# Patient Record
Sex: Male | Born: 1965 | Race: White | Hispanic: No | Marital: Single | Smoking: Former smoker
Health system: Southern US, Community
[De-identification: ages and names within clinical notes are randomized; demographics above are authoritative.]

## PROBLEM LIST (undated history)

## (undated) DIAGNOSIS — R7303 Prediabetes: Secondary | ICD-10-CM

## (undated) DIAGNOSIS — E039 Hypothyroidism, unspecified: Secondary | ICD-10-CM

## (undated) HISTORY — DX: Hypothyroidism, unspecified: E03.9

## (undated) HISTORY — PX: SHOULDER SURGERY: SHX246

## (undated) HISTORY — DX: Prediabetes: R73.03

---

## 2010-08-29 ENCOUNTER — Emergency Department: Payer: Self-pay | Admitting: Internal Medicine

## 2013-03-22 ENCOUNTER — Ambulatory Visit: Payer: Self-pay | Admitting: General Practice

## 2013-07-18 ENCOUNTER — Ambulatory Visit: Payer: Self-pay | Admitting: Anesthesiology

## 2013-07-18 LAB — CBC
HCT: 41.9 % (ref 40.0–52.0)
HGB: 14.6 g/dL (ref 13.0–18.0)
MCH: 30.9 pg (ref 26.0–34.0)
MCV: 89 fL (ref 80–100)
Platelet: 255 10*3/uL (ref 150–440)
WBC: 6.9 10*3/uL (ref 3.8–10.6)

## 2013-07-18 LAB — BASIC METABOLIC PANEL
Anion Gap: 6 — ABNORMAL LOW (ref 7–16)
BUN: 12 mg/dL (ref 7–18)
Calcium, Total: 9.2 mg/dL (ref 8.5–10.1)
Chloride: 107 mmol/L (ref 98–107)
Co2: 27 mmol/L (ref 21–32)
Creatinine: 0.99 mg/dL (ref 0.60–1.30)
EGFR (African American): >60
EGFR (Non-African Amer.): >60
Potassium: 3.5 mmol/L (ref 3.5–5.1)
Sodium: 140 mmol/L (ref 136–145)

## 2013-07-18 LAB — APTT: Activated PTT: 29.2 secs (ref 23.6–35.9)

## 2013-07-20 ENCOUNTER — Ambulatory Visit: Payer: Self-pay | Admitting: Orthopedic Surgery

## 2014-08-08 ENCOUNTER — Ambulatory Visit (INDEPENDENT_AMBULATORY_CARE_PROVIDER_SITE_OTHER): Payer: 59 | Admitting: Psychology

## 2014-08-08 DIAGNOSIS — F4323 Adjustment disorder with mixed anxiety and depressed mood: Secondary | ICD-10-CM

## 2014-08-23 ENCOUNTER — Ambulatory Visit (INDEPENDENT_AMBULATORY_CARE_PROVIDER_SITE_OTHER): Payer: 59 | Admitting: Psychology

## 2014-08-23 DIAGNOSIS — F4323 Adjustment disorder with mixed anxiety and depressed mood: Secondary | ICD-10-CM

## 2014-09-13 ENCOUNTER — Ambulatory Visit (INDEPENDENT_AMBULATORY_CARE_PROVIDER_SITE_OTHER): Payer: 59 | Admitting: Psychology

## 2014-09-13 DIAGNOSIS — F4323 Adjustment disorder with mixed anxiety and depressed mood: Secondary | ICD-10-CM

## 2014-09-27 ENCOUNTER — Ambulatory Visit (INDEPENDENT_AMBULATORY_CARE_PROVIDER_SITE_OTHER): Payer: 59 | Admitting: Psychology

## 2014-09-27 DIAGNOSIS — F4323 Adjustment disorder with mixed anxiety and depressed mood: Secondary | ICD-10-CM

## 2014-10-11 ENCOUNTER — Ambulatory Visit (INDEPENDENT_AMBULATORY_CARE_PROVIDER_SITE_OTHER): Payer: 59 | Admitting: Psychology

## 2014-10-11 DIAGNOSIS — F4323 Adjustment disorder with mixed anxiety and depressed mood: Secondary | ICD-10-CM

## 2014-12-29 NOTE — Op Note (Signed)
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   PROCEDURE: Right shoulder arthroscopic subacromial decompression, distal clavicle excision, and mini open rotator cuff tear repair.   ANESTHESIA: General with right interscalene block.   SURGEON: Thornton Park, MD   ESTIMATED BLOOD LOSS: Minimal.   COMPLICATIONS: None.   IMPLANTS: ArthroCare Magnum M anchor x 1 and ArthroCare Magnum 2 anchors x 2.   INDICATIONS FOR PROCEDURE: [name] is a 49 year old male with persistent right shoulder pain since May or June 2014. He does not recall any specific injury. 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 The patient has failed to progress with nonoperative management including nonsteroidal anti-inflammatory, corticosteroid injections and physical therapy. He had an MRI which had showed rotator cuff tendinosis, tendinosis of the biceps, and suggested an interstitial tear of the supraspinatus near its attachment to the greater tuberosity. Given his failure to respond to conservative management, the patient elected to proceed with surgical intervention. Preoperatively, I reviewed the surgical plan and the associated risks. He understands the risks include infection, bleeding, nerve or blood vessel injury, especially injury to the axillary nerve, re-tear of the rotator cuff, failure of the hardware, persistent shoulder pain, shoulder stiffness, and the need for further surgery. Medical complications include, but are not limited to DVT and pulmonary embolism, myocardial infarction, stroke, pneumonia, respiratory failure, and  death. The patient understood these risks and wished to proceed.   PROCEDURE NOTE: I met with the patient in the preoperative area. His wife was at the bedside. I answered all the patient's questions regarding the procedure. I explained the postoperative course. I signed the right shoulder with the word "yes" according to the hospital's right site protocol, after verbally confirming with the patient that this was the correct site of surgery. I also confirmed this with my office notes and the radiographic studies. The patient was then taken to the operating room after he received a right interscalene block. He then underwent general endotracheal intubation. The patient was placed in a beach chair position, and a spider arm positioner was used for this case.   An examination under anesthesia revealed full range of motion with no evidence of instability with load and shift testing. He has a negative sulcus sign.   The patient was prepped and draped in a sterile fashion. A timeout was performed to verify the patient's name, date of birth, medical record number, correct site of surgery, and correct procedure to be performed. It was also used to verify the patient had received antibiotics and that all appropriate instruments, implants, and radiographic studies were available in the room. Once all in attendance were in agreement, the case began.   The patient's bony landmarks were outlined with a surgical marker along with the proposed arthroscopy incisions. These incisions were pre-injected with 1% lidocaine plain. A #11 blade was used to establish a posterior portal. The posterior muscular fibers were carefully separated using a hemostat prior to entry of the trocar into the glenohumeral joint. Once the arthroscope was positioned in the glenohumeral joint, an anterior portal was established under direct visualization using an 18-gauge spinal needle for localization. A #11 blade was used to make the anterior  incision. A  5.75 mm arthroscopic cannula was then placed through the anterior portal. A nerve hook was then inserted through this portal to allow for a full diagnostic evaluation of the glenohumeral joint. This included the glenoid and humeral head surfaces, the full evaluation of the labrum and biceps, evaluation of the subscapularis and rotator cuff. The inferior recess was also evaluated. The findings on arthroscopy included no evidence of labral tear. The biceps tendon was intact and without evidence of significant tendinosis. The subscapularis was also intact. There were no loose bodies or HAGL lesion. There were no focal chondral defects of the humeral head or glenoid. The patient did have some fraying of the supraspinatus without full-thickness tear from the articular surface. An 18-gauge spinal needle was then used to insert a 0 PDS suture through the supraspinatus to localize the supraspinatus from the bursal side.   The arthroscope was then removed from the glenohumeral joint and positioned into the subacromial space. There was bursitis encountered in the subacromial space, which was mild. The patient had a bursectomy performed. This allowed for excellent visualization of the rotator cuff. The 0 PDS was localized. A lateral portal had been established in order to perform the bursectomy. This again was created with an 18-gauge spinal needle for localization. A nerve hook was then placed through the lateral portal and was used to probe the bursal side of the rotator cuff tear. The nerve hook easily penetrated into the supraspinatus from the bursal side, indicating an interstitial tear, which was consistent with his MRI findings. Given the fact that the patient was having significant pain and did not show significant bursitis, I believe that the physical exam findings and history are consistent with this interstitial tear. I made the decision therefore to take down the patient's supraspinatus. I used a 4.0  resector shaver blade to take down the supraspinatus at its greater tuberosity attachment site. During the rotator cuff takedown, I viewed his both from the posterior portal and the subacromial space as well as from the glenohumeral joint to ensure no articular injury was created at the humeral head.   Once the supraspinatus had been taken down, an Delphi stitch was placed in the lateral edge of the rotator cuff. This would allow for localization during the mini open approach. The 0 PDS suture was removed.   A subacromial decompression was then performed via the lateral portal using a 5.5 mm resector shaver blade. The resector shaver blade was then placed in the anterior portal and a distal clavicle excision also performed, as the patient had evidence of subacromial spurring and AC  joint arthrosis by MRI.   The subacromial space was then copiously irrigated. All bony debris was removed. The arthroscopic instruments were then removed.   A #15 blade was used to then create a saber-type incision over the lateral border of the acromion. The subcutaneous tissue was dissected carefully with electrocautery. All bleeding vessels were cauterized with electrocautery as well. The deltoid fascia was then identified. The deltoid was split in line with its fibers, allowing visualization of the underlying rotator cuff. The Smart stitch which had been previously placed in the lateral border of the rotator cuff was easily identified and brought out through the deltoid split. The greater tuberosity was cleaned of any remaining fibers of the supraspinatus using the 5.5 mm resector shaver blade. A second additional Smart stitch was placed in the lateral border of the supraspinatus. Next, a drill hole was placed at the articular margin to allow  for placement of a single Magnum M anchor. Both sutures of this anchor were then passed using a FirstPass suture passer through the medial portion of the rotator cuff. These  sutures were then clamped with a hemostat for later repair.   Next, a drill was used to create 2 lateral drill holes greater than 5 mm apart for placement of Magnum 2 anchors. The Smart stitches were each loaded into a Magnum 2 anchor, which were then inserted in the drill holes. These were then tensioned to approximate the lateral border of the rotator cuff against the lateralmost aspect of the greater tuberosity footprint. Finally, the Magnum M anchor sutures were then tied down to complete the double-row fixation of the rotator cuff. The wound was then copiously irrigated and sutures were cut. External images of the rotator cuff repair were taken with the arthroscope. The arthroscope was then inserted back into the glenohumeral joint and arthroscopic images of the repair from inside the glenohumeral joint were also taken. The wound was again copiously irrigated. All arthroscopic instruments were removed. The deltoid fascia was closed with 0 Vicryl and the subcutaneous tissue closed with 2-0 Vicryl. The skin of the saber incision was closed with a running 4-0 Monocryl. The 3 arthroscopy portals were closed with 4-0 nylon. Steri-Strips were applied. A dry sterile dressing was also applied to the patient's right shoulder along with TENS unit leads, a Polar Care sleeve, and a right shoulder abduction sling. The patient was then awoken and transferred to a hospital stretcher. He was brought to the PACU in stable condition. I was scrubbed and present for the entire case, and all sharp and instrument counts were correct at the conclusion of the case. I spoke with the patient's wife in the postop consultation room following the surgery to let her know that the patient was stable in the recovery room and the case had gone without complication. She is aware that the rotator cuff was repaired.    ____________________________ Timoteo Gaul, MD [id number] D: 07/23/2013 18:08:12 ET T: 07/23/2013 18:29:27  ET JOB#: [id number]  cc: Timoteo Gaul, MD, <Dictator> Timoteo Gaul MD ELECTRONICALLY SIGNED 08/01/2013 19:24

## 2015-07-10 ENCOUNTER — Ambulatory Visit: Payer: Self-pay | Admitting: Psychology

## 2020-04-09 DIAGNOSIS — Z0189 Encounter for other specified special examinations: Secondary | ICD-10-CM | POA: Diagnosis not present

## 2020-07-19 DIAGNOSIS — R109 Unspecified abdominal pain: Secondary | ICD-10-CM | POA: Diagnosis not present

## 2020-07-23 ENCOUNTER — Other Ambulatory Visit: Payer: Self-pay | Admitting: General Surgery

## 2020-07-23 DIAGNOSIS — R1013 Epigastric pain: Secondary | ICD-10-CM

## 2020-07-31 ENCOUNTER — Other Ambulatory Visit: Payer: Self-pay

## 2020-07-31 ENCOUNTER — Ambulatory Visit
Admission: RE | Admit: 2020-07-31 | Discharge: 2020-07-31 | Disposition: A | Discharge status: Home / Self Care | Payer: BC Managed Care – PPO | Source: Ambulatory Visit | Attending: General Surgery | Admitting: General Surgery

## 2020-07-31 DIAGNOSIS — K5790 Diverticulosis of intestine, part unspecified, without perforation or abscess without bleeding: Secondary | ICD-10-CM | POA: Diagnosis not present

## 2020-07-31 DIAGNOSIS — K439 Ventral hernia without obstruction or gangrene: Secondary | ICD-10-CM | POA: Diagnosis not present

## 2020-07-31 DIAGNOSIS — R1013 Epigastric pain: Secondary | ICD-10-CM | POA: Diagnosis not present

## 2020-07-31 DIAGNOSIS — K429 Umbilical hernia without obstruction or gangrene: Secondary | ICD-10-CM | POA: Diagnosis not present

## 2020-07-31 DIAGNOSIS — K469 Unspecified abdominal hernia without obstruction or gangrene: Secondary | ICD-10-CM | POA: Diagnosis not present

## 2020-08-08 DIAGNOSIS — U071 COVID-19: Secondary | ICD-10-CM

## 2020-08-08 HISTORY — DX: COVID-19: U07.1

## 2020-08-12 DIAGNOSIS — Z20822 Contact with and (suspected) exposure to covid-19: Secondary | ICD-10-CM | POA: Diagnosis not present

## 2020-08-12 DIAGNOSIS — U071 COVID-19: Secondary | ICD-10-CM | POA: Diagnosis not present

## 2020-08-16 DIAGNOSIS — Z6836 Body mass index (BMI) 36.0-36.9, adult: Secondary | ICD-10-CM | POA: Diagnosis not present

## 2020-08-16 DIAGNOSIS — J1282 Pneumonia due to coronavirus disease 2019: Secondary | ICD-10-CM | POA: Diagnosis not present

## 2020-08-16 DIAGNOSIS — B370 Candidal stomatitis: Secondary | ICD-10-CM | POA: Diagnosis not present

## 2020-08-16 DIAGNOSIS — R0600 Dyspnea, unspecified: Secondary | ICD-10-CM | POA: Diagnosis not present

## 2020-08-16 DIAGNOSIS — E669 Obesity, unspecified: Secondary | ICD-10-CM | POA: Diagnosis not present

## 2020-08-16 DIAGNOSIS — Z7952 Long term (current) use of systemic steroids: Secondary | ICD-10-CM | POA: Diagnosis not present

## 2020-08-16 DIAGNOSIS — R0602 Shortness of breath: Secondary | ICD-10-CM | POA: Diagnosis not present

## 2020-08-16 DIAGNOSIS — R918 Other nonspecific abnormal finding of lung field: Secondary | ICD-10-CM | POA: Diagnosis not present

## 2020-08-16 DIAGNOSIS — R0902 Hypoxemia: Secondary | ICD-10-CM | POA: Diagnosis not present

## 2020-08-16 DIAGNOSIS — Z6835 Body mass index (BMI) 35.0-35.9, adult: Secondary | ICD-10-CM | POA: Diagnosis not present

## 2020-08-16 DIAGNOSIS — R791 Abnormal coagulation profile: Secondary | ICD-10-CM | POA: Diagnosis not present

## 2020-08-16 DIAGNOSIS — R000 Tachycardia, unspecified: Secondary | ICD-10-CM | POA: Diagnosis not present

## 2020-08-16 DIAGNOSIS — Z9989 Dependence on other enabling machines and devices: Secondary | ICD-10-CM | POA: Diagnosis not present

## 2020-08-16 DIAGNOSIS — D72829 Elevated white blood cell count, unspecified: Secondary | ICD-10-CM | POA: Diagnosis not present

## 2020-08-16 DIAGNOSIS — J9691 Respiratory failure, unspecified with hypoxia: Secondary | ICD-10-CM | POA: Diagnosis not present

## 2020-08-16 DIAGNOSIS — D689 Coagulation defect, unspecified: Secondary | ICD-10-CM | POA: Diagnosis not present

## 2020-08-16 DIAGNOSIS — J9601 Acute respiratory failure with hypoxia: Secondary | ICD-10-CM | POA: Diagnosis not present

## 2020-08-16 DIAGNOSIS — E039 Hypothyroidism, unspecified: Secondary | ICD-10-CM | POA: Diagnosis not present

## 2020-08-16 DIAGNOSIS — Z9981 Dependence on supplemental oxygen: Secondary | ICD-10-CM | POA: Diagnosis not present

## 2020-08-16 DIAGNOSIS — Z79890 Hormone replacement therapy (postmenopausal): Secondary | ICD-10-CM | POA: Diagnosis not present

## 2020-08-16 DIAGNOSIS — U071 COVID-19: Secondary | ICD-10-CM | POA: Diagnosis not present

## 2020-08-16 DIAGNOSIS — M549 Dorsalgia, unspecified: Secondary | ICD-10-CM | POA: Diagnosis not present

## 2020-08-16 DIAGNOSIS — D6859 Other primary thrombophilia: Secondary | ICD-10-CM | POA: Diagnosis not present

## 2020-08-28 DIAGNOSIS — U071 COVID-19: Secondary | ICD-10-CM | POA: Diagnosis not present

## 2020-08-28 DIAGNOSIS — E669 Obesity, unspecified: Secondary | ICD-10-CM | POA: Diagnosis not present

## 2020-08-28 DIAGNOSIS — E039 Hypothyroidism, unspecified: Secondary | ICD-10-CM | POA: Diagnosis not present

## 2020-08-30 DIAGNOSIS — E669 Obesity, unspecified: Secondary | ICD-10-CM | POA: Diagnosis not present

## 2020-08-30 DIAGNOSIS — U071 COVID-19: Secondary | ICD-10-CM | POA: Diagnosis not present

## 2020-08-30 DIAGNOSIS — E039 Hypothyroidism, unspecified: Secondary | ICD-10-CM | POA: Diagnosis not present

## 2020-09-03 DIAGNOSIS — E039 Hypothyroidism, unspecified: Secondary | ICD-10-CM | POA: Diagnosis not present

## 2020-09-03 DIAGNOSIS — E669 Obesity, unspecified: Secondary | ICD-10-CM | POA: Diagnosis not present

## 2020-09-03 DIAGNOSIS — U071 COVID-19: Secondary | ICD-10-CM | POA: Diagnosis not present

## 2020-09-04 DIAGNOSIS — E669 Obesity, unspecified: Secondary | ICD-10-CM | POA: Diagnosis not present

## 2020-09-04 DIAGNOSIS — U071 COVID-19: Secondary | ICD-10-CM | POA: Diagnosis not present

## 2020-09-04 DIAGNOSIS — E039 Hypothyroidism, unspecified: Secondary | ICD-10-CM | POA: Diagnosis not present

## 2020-09-05 DIAGNOSIS — E669 Obesity, unspecified: Secondary | ICD-10-CM | POA: Diagnosis not present

## 2020-09-05 DIAGNOSIS — E039 Hypothyroidism, unspecified: Secondary | ICD-10-CM | POA: Diagnosis not present

## 2020-09-05 DIAGNOSIS — U071 COVID-19: Secondary | ICD-10-CM | POA: Diagnosis not present

## 2020-09-11 DIAGNOSIS — E669 Obesity, unspecified: Secondary | ICD-10-CM | POA: Diagnosis not present

## 2020-09-11 DIAGNOSIS — E039 Hypothyroidism, unspecified: Secondary | ICD-10-CM | POA: Diagnosis not present

## 2020-09-11 DIAGNOSIS — U071 COVID-19: Secondary | ICD-10-CM | POA: Diagnosis not present

## 2020-09-13 DIAGNOSIS — U071 COVID-19: Secondary | ICD-10-CM | POA: Diagnosis not present

## 2020-09-13 DIAGNOSIS — E669 Obesity, unspecified: Secondary | ICD-10-CM | POA: Diagnosis not present

## 2020-09-13 DIAGNOSIS — E039 Hypothyroidism, unspecified: Secondary | ICD-10-CM | POA: Diagnosis not present

## 2020-09-14 DIAGNOSIS — E039 Hypothyroidism, unspecified: Secondary | ICD-10-CM | POA: Diagnosis not present

## 2020-09-14 DIAGNOSIS — U071 COVID-19: Secondary | ICD-10-CM | POA: Diagnosis not present

## 2020-09-14 DIAGNOSIS — E669 Obesity, unspecified: Secondary | ICD-10-CM | POA: Diagnosis not present

## 2020-09-17 DIAGNOSIS — U071 COVID-19: Secondary | ICD-10-CM | POA: Diagnosis not present

## 2020-09-17 DIAGNOSIS — E039 Hypothyroidism, unspecified: Secondary | ICD-10-CM | POA: Diagnosis not present

## 2020-09-17 DIAGNOSIS — E669 Obesity, unspecified: Secondary | ICD-10-CM | POA: Diagnosis not present

## 2020-09-18 DIAGNOSIS — U071 COVID-19: Secondary | ICD-10-CM | POA: Diagnosis not present

## 2020-09-18 DIAGNOSIS — Z125 Encounter for screening for malignant neoplasm of prostate: Secondary | ICD-10-CM | POA: Diagnosis not present

## 2020-09-18 DIAGNOSIS — R7303 Prediabetes: Secondary | ICD-10-CM | POA: Diagnosis not present

## 2020-09-18 DIAGNOSIS — E039 Hypothyroidism, unspecified: Secondary | ICD-10-CM | POA: Diagnosis not present

## 2020-09-19 DIAGNOSIS — E669 Obesity, unspecified: Secondary | ICD-10-CM | POA: Diagnosis not present

## 2020-09-19 DIAGNOSIS — E039 Hypothyroidism, unspecified: Secondary | ICD-10-CM | POA: Diagnosis not present

## 2020-09-19 DIAGNOSIS — U071 COVID-19: Secondary | ICD-10-CM | POA: Diagnosis not present

## 2020-09-21 DIAGNOSIS — Z23 Encounter for immunization: Secondary | ICD-10-CM | POA: Diagnosis not present

## 2020-10-12 DIAGNOSIS — U071 COVID-19: Secondary | ICD-10-CM | POA: Diagnosis not present

## 2020-10-19 DIAGNOSIS — Z23 Encounter for immunization: Secondary | ICD-10-CM | POA: Diagnosis not present

## 2020-11-01 ENCOUNTER — Other Ambulatory Visit: Payer: Self-pay

## 2020-11-01 ENCOUNTER — Ambulatory Visit: Payer: BC Managed Care – PPO | Admitting: Internal Medicine

## 2020-11-01 ENCOUNTER — Encounter: Payer: Self-pay | Admitting: Internal Medicine

## 2020-11-01 VITALS — BP 140/90 | HR 112 | Ht 73.0 in | Wt 272.0 lb

## 2020-11-01 DIAGNOSIS — R000 Tachycardia, unspecified: Secondary | ICD-10-CM | POA: Diagnosis not present

## 2020-11-01 DIAGNOSIS — U099 Post covid-19 condition, unspecified: Secondary | ICD-10-CM

## 2020-11-01 DIAGNOSIS — R0600 Dyspnea, unspecified: Secondary | ICD-10-CM

## 2020-11-01 DIAGNOSIS — R0609 Other forms of dyspnea: Secondary | ICD-10-CM | POA: Diagnosis not present

## 2020-11-01 DIAGNOSIS — R0602 Shortness of breath: Secondary | ICD-10-CM

## 2020-11-01 NOTE — Progress Notes (Unsigned)
New Outpatient Visit Date: 11/01/2020  Referring Provider: Lorn Junes, FNP [address] [place],  [place] [address]  Chief Complaint: Elevated heart rate  HPI:  Brad Lewis is a 55 y.o. male who is being seen today for the evaluation of tachycardia at the request of Lorn Junes, FNP. He has a history of prediabetes and hypothyroidism.  He was hospitalized at [facility] for 2 weeks with COVID-19 in 08/2020.  He required protracted stay in the ICU with high flow oxygen but never required intubation.  He required supplemental oxygen at the time of discharge though he has been weaned from this.  Since leaving the hospital, Brad Lewis notes that his heart rate has been elevated though this is gradually improving.  He still has mild exertional dyspnea though he is almost back to baseline.  He has not had any chest pain, lightheadedness,, orthopnea, PND, or edema.  Prior to his COVID-19 infection, Brad Lewis did not have any palpitations/tachycardia nor shortness of breath.  He denies a history of prior heart disease and testing. --------------------------------------------------------------------------------------------------  Cardiovascular History & Procedures: Cardiovascular Problems:  Dyspnea on exertion following COVID-19  Sinus tachycardia  Risk Factors:  Prediabetes, male gender, obesity, prior tobacco use, and age greater than 58  Cath/PCI:  None  CV Surgery:  None  EP Procedures and Devices:  None  Non-Invasive Evaluation(s):  None  Recent CV Pertinent Labs: Lab Results  Component Value Date   INR 0.9 07/18/2013   K 3.5 07/18/2013   BUN 12 07/18/2013   CREATININE 0.99 07/18/2013    --------------------------------------------------------------------------------------------------  Past Medical History:  Diagnosis Date  . COVID-19 08/2020  . Hypothyroidism   . Prediabetes     Past Surgical History:  Procedure Laterality Date  . SHOULDER SURGERY  Right     Current Meds  Medication Sig  . levothyroxine (SYNTHROID) 88 MCG tablet Take 88 mcg by mouth daily before breakfast.  . Multiple Vitamin (ONE-A-DAY MENS PO) Take by mouth daily.    Allergies: Patient has no known allergies.  Social History   Tobacco Use  . Smoking status: Former Games developer  . Smokeless tobacco: Never Used  . Tobacco comment: quit 10-15 yrs ago  Vaping Use  . Vaping Use: Never used  Substance Use Topics  . Alcohol use: Never  . Drug use: Never    Family History  Problem Relation Age of Onset  . Dementia Mother   . Suicidality Father   . Multiple sclerosis Sister   . Heart disease Neg Hx     Review of Systems: A 12-system review of systems was performed and was negative except as noted in the HPI.  --------------------------------------------------------------------------------------------------  Physical Exam: BP 140/90 (BP Location: Right Arm, Patient Position: Sitting, Cuff Size: Large)   Pulse (!) 112   Ht 6\' 1"  (1.854 m)   Wt 272 lb (123.4 kg)   SpO2 97%   BMI 35.89 kg/m   General:  NAD HEENT: No conjunctival pallor or scleral icterus. Facemask in place. Neck: Supple without lymphadenopathy, thyromegaly, JVD, or HJR. No carotid bruit. Lungs: Normal work of breathing. Clear to auscultation bilaterally without wheezes or crackles. Heart: Tachycardic but regular without murmurs, rubs, or gallops. Non-displaced PMI. Abd: Bowel sounds present. Soft, NT/ND without hepatosplenomegaly Ext: No lower extremity edema. Radial, PT, and DP pulses are 2+ bilaterally Skin: Warm and dry without rash. Neuro: CNIII-XII intact. Strength and fine-touch sensation intact in upper and lower extremities bilaterally. Psych: Normal mood and affect.  EKG: Sinus tachycardia (heart rate 112 bpm).  Otherwise, no significant abnormality.  Lab Results  Component Value Date   WBC 6.9 07/18/2013   HGB 14.6 07/18/2013   HCT 41.9 07/18/2013   MCV 89 07/18/2013    PLT 255 07/18/2013    Lab Results  Component Value Date   NA 140 07/18/2013   K 3.5 07/18/2013   CL 107 07/18/2013   CO2 27 07/18/2013   BUN 12 07/18/2013   CREATININE 0.99 07/18/2013   GLUCOSE 136 (H) 07/18/2013    --------------------------------------------------------------------------------------------------  ASSESSMENT AND PLAN: Sinus tachycardia: EKG is notable for sinus tachycardia but no frank arrhythmia.  Brad Lewis and I discussed that sinus tachycardia is usually a compensatory mechanism for some other physiologic stressor on the body.  In the setting of a severe COVID-19 infection a few months ago, is not unexpected for his heart rate to be elevated.  He does not have any signs or symptoms of overt heart failure, though he still has mild exertional dyspnea.  I have recommended that we obtain a transthoracic echocardiogram to evaluate for structural abnormalities including cardiomyopathy that could be associated with COVID-19.  He certainly would be at risk for pulmonary embolism as well, though given that his hospitalization was over 2 months ago and his symptoms are improving, I have a low suspicion for continued PE.  However, if there is evidence of significant pulmonary hypertension or RV dysfunction, will need to evaluate further for this.  Of note, TSH was normal during hospitalization at [facility] in 08/2020.  PCP also checked labs earlier this week, results of which are not available.  Dyspnea on exertion: Most likely related to COVID-19 infection late last year.  We will obtain an echo, as above.  If symptoms do not continue to improve and echo was unremarkable, further evaluation for underlying lung disease will need to be considered.  Follow-up: Return to clinic in 2 months.  Yvonne Kendall, MD 11/02/2020 2:08 PM

## 2020-11-01 NOTE — Patient Instructions (Signed)
Medication Instructions:  Your physician recommends that you continue on your current medications as directed. Please refer to the Current Medication list given to you today.  *If you need a refill on your cardiac medications before your next appointment, please call your pharmacy*  Lab Work: none If you have labs (blood work) drawn today and your tests are completely normal, you will receive your results only by: Marland Kitchen MyChart Message (if you have MyChart) OR . A paper copy in the mail If you have any lab test that is abnormal or we need to change your treatment, we will call you to review the results.  Testing/Procedures: Your physician has requested that you have an echocardiogram. Echocardiography is a painless test that uses sound waves to create images of your heart. It provides your doctor with information about the size and shape of your heart and how well your heart's chambers and valves are working. This procedure takes approximately one hour. There are no restrictions for this procedure. There is a possibility that an IV may need to be started during your test to inject an image enhancing agent. This is done to obtain more optimal pictures of your heart. Therefore we ask that you do at least drink some water prior to coming in to hydrate your veins.   Follow-Up: At [facility], you and your health needs are our priority.  As part of our continuing mission to provide you with exceptional heart care, we have created designated Provider Care Teams.  These Care Teams include your primary Cardiologist (physician) and Advanced Practice Providers (APPs -  Physician Assistants and Nurse Practitioners) who all work together to provide you with the care you need, when you need it.  We recommend signing up for the patient portal called "MyChart".  Sign up information is provided on this After Visit Summary.  MyChart is used to connect with patients for Virtual Visits (Telemedicine).  Patients are  able to view lab/test results, encounter notes, upcoming appointments, etc.  Non-urgent messages can be sent to your provider as well.   To learn more about what you can do with MyChart, go to [email or web address].    Your next appointment:   2 month(s)  The format for your next appointment:   In Person  Provider:   You may see DR Cristal Deer END or one of the following Advanced Practice Providers on your designated Care Team:    Nicolasa Ducking, NP  Eula Listen, PA-C  Marisue Ivan, PA-C  Cadence [place], [place]  Gillian Shields, NP

## 2020-11-02 ENCOUNTER — Encounter: Payer: Self-pay | Admitting: Internal Medicine

## 2020-11-02 DIAGNOSIS — R0602 Shortness of breath: Secondary | ICD-10-CM | POA: Insufficient documentation

## 2020-11-02 DIAGNOSIS — R0609 Other forms of dyspnea: Secondary | ICD-10-CM | POA: Insufficient documentation

## 2020-11-02 DIAGNOSIS — R000 Tachycardia, unspecified: Secondary | ICD-10-CM | POA: Insufficient documentation

## 2020-11-02 DIAGNOSIS — R0600 Dyspnea, unspecified: Secondary | ICD-10-CM | POA: Insufficient documentation

## 2020-11-21 ENCOUNTER — Other Ambulatory Visit: Payer: Self-pay

## 2020-11-21 ENCOUNTER — Ambulatory Visit (INDEPENDENT_AMBULATORY_CARE_PROVIDER_SITE_OTHER): Payer: BC Managed Care – PPO

## 2020-11-21 DIAGNOSIS — U099 Post covid-19 condition, unspecified: Secondary | ICD-10-CM | POA: Diagnosis not present

## 2020-11-21 DIAGNOSIS — R0609 Other forms of dyspnea: Secondary | ICD-10-CM

## 2020-11-21 DIAGNOSIS — R000 Tachycardia, unspecified: Secondary | ICD-10-CM

## 2020-11-21 DIAGNOSIS — R0600 Dyspnea, unspecified: Secondary | ICD-10-CM

## 2020-11-21 LAB — ECHOCARDIOGRAM COMPLETE
AR max vel: 2.99 cm2
AV Area VTI: 3.15 cm2
AV Area mean vel: 2.91 cm2
AV Mean grad: 5 mmHg
AV Peak grad: 8.5 mmHg
Ao pk vel: 1.46 m/s
Area-P 1/2: 3.79 cm2
Calc EF: 50.7 %
S' Lateral: 3.1 cm
Single Plane A2C EF: 50.3 %
Single Plane A4C EF: 50.9 %

## 2021-01-04 ENCOUNTER — Ambulatory Visit (INDEPENDENT_AMBULATORY_CARE_PROVIDER_SITE_OTHER): Payer: BC Managed Care – PPO | Admitting: Internal Medicine

## 2021-01-04 ENCOUNTER — Encounter: Payer: Self-pay | Admitting: Internal Medicine

## 2021-01-04 ENCOUNTER — Other Ambulatory Visit: Payer: Self-pay

## 2021-01-04 VITALS — BP 120/80 | HR 90 | Ht 73.0 in | Wt 261.0 lb

## 2021-01-04 DIAGNOSIS — R072 Precordial pain: Secondary | ICD-10-CM | POA: Diagnosis not present

## 2021-01-04 DIAGNOSIS — R0602 Shortness of breath: Secondary | ICD-10-CM | POA: Diagnosis not present

## 2021-01-04 DIAGNOSIS — R1011 Right upper quadrant pain: Secondary | ICD-10-CM | POA: Diagnosis not present

## 2021-01-04 DIAGNOSIS — R000 Tachycardia, unspecified: Secondary | ICD-10-CM

## 2021-01-04 DIAGNOSIS — R079 Chest pain, unspecified: Secondary | ICD-10-CM | POA: Diagnosis not present

## 2021-01-04 MED ORDER — METOPROLOL TARTRATE 100 MG PO TABS: ORAL_TABLET | ORAL | 0 refills | Status: DC

## 2021-01-04 MED ORDER — METOPROLOL SUCCINATE ER 25 MG PO TB24: 25.0000 mg | ORAL_TABLET | Freq: Every day | ORAL | 5 refills | Status: DC

## 2021-01-04 NOTE — Patient Instructions (Signed)
Medication Instructions:  Your physician has recommended you make the following change in your medication:   START Metoprolol Succinate 25 mg daily. An Rx has been sent to your pharmacy.  A one time dose of Metoprolol Tartrate 100 mg tablet to be taken 2 hours prior to your Cardiac CTA has been sent to your pharmacy  *If you need a refill on your cardiac medications before your next appointment, please call your pharmacy*   Lab Work: You will need labwork 2-3 days prior to your Cardiac CT. Please have your lab drawn at the [facility] medical mall. No appt needed. Lab hours are Mon-Fri 7am-6pm.   If you have labs (blood work) drawn today and your tests are completely normal, you will receive your results only by: Marland Kitchen MyChart Message (if you have MyChart) OR . A paper copy in the mail If you have any lab test that is abnormal or we need to change your treatment, we will call you to review the results.   Testing/Procedures: Your physician has requested that you have cardiac CT. Cardiac computed tomography (CT) is a painless test that uses an x-ray machine to take clear, detailed pictures of your heart. For further information please visit [email or web address] Please follow instruction sheet as given.      Follow-Up: At [facility], you and your health needs are our priority.  As part of our continuing mission to provide you with exceptional heart care, we have created designated Provider Care Teams.  These Care Teams include your primary Cardiologist (physician) and Advanced Practice Providers (APPs -  Physician Assistants and Nurse Practitioners) who all work together to provide you with the care you need, when you need it.  We recommend signing up for the patient portal called "MyChart".  Sign up information is provided on this After Visit Summary.  MyChart is used to connect with patients for Virtual Visits (Telemedicine).  Patients are able to view lab/test results, encounter notes,  upcoming appointments, etc.  Non-urgent messages can be sent to your provider as well.   To learn more about what you can do with MyChart, go to [email or web address].    Your next appointment:   3 month(s)  The format for your next appointment:   In Person  Provider:   You may see Yvonne Kendall, MD or one of the following Advanced Practice Providers on your designated Care Team:    Nicolasa Ducking, NP  Eula Listen, PA-C  Marisue Ivan, PA-C  Cadence Fransico Michael, [place]  Gillian Shields, NP    Other Instructions Your cardiac CT will be scheduled at one of the below locations:   [facility] [address] [place], [place] [address] [phone]  OR  [facility] [address] [place], [place] [address] [phone]  If scheduled at [facility], please arrive at the [facility] main entrance (entrance A) of [facility] 30 minutes prior to test start time. Proceed to the [facility] Radiology Department (first floor) to check-in and test prep.  If scheduled at [facility], please arrive 15 mins early for check-in and test prep.  Please follow these instructions carefully (unless otherwise directed):  Hold all erectile dysfunction medications at least 3 days (72 hrs) prior to test.  On the Night Before the Test: . Be sure to Drink plenty of water. . Do not consume any caffeinated/decaffeinated beverages or chocolate 12 hours prior to your test. . Do not take any antihistamines  12 hours prior to your test.   On the Day of the Test: . Drink plenty of water until 1 hour prior to the test. . Do not eat any food 4 hours prior to the test. . You may take your regular medications prior to the test.  . Take metoprolol (Lopressor) two hours prior to test.       After the Test: . Drink plenty of water. . After receiving IV contrast, you may experience a mild flushed  feeling. This is normal. . On occasion, you may experience a mild rash up to 24 hours after the test. This is not dangerous. If this occurs, you can take Benadryl 25 mg and increase your fluid intake. . If you experience trouble breathing, this can be serious. If it is severe call 911 IMMEDIATELY. If it is mild, please call our office. . If you take any of these medications: Glipizide/Metformin, Avandament, Glucavance, please do not take 48 hours after completing test unless otherwise instructed.   Once we have confirmed authorization from your insurance company, we will call you to set up a date and time for your test. Based on how quickly your insurance processes prior authorizations requests, please allow up to 4 weeks to be contacted for scheduling your Cardiac CT appointment. Be advised that routine Cardiac CT appointments could be scheduled as many as 8 weeks after your provider has ordered it.  For non-scheduling related questions, please contact the cardiac imaging nurse navigator should you have any questions/concerns: Rockwell [place], Cardiac Imaging Nurse Navigator Larey Brick, Cardiac Imaging Nurse Navigator [facility] Direct Office Dial: [phone]   For scheduling needs, including cancellations and rescheduling, please call [place], [phone].

## 2021-01-04 NOTE — Progress Notes (Signed)
Follow-up Outpatient Visit Date: 01/04/2021  Primary Care Provider: Bunnie Pion, [place] [address]  Chief Complaint: Follow-up shortness of breath and tachycardia  HPI:  Brad Lewis is a 55 y.o. male with history of severe COVID-19 infection in 08/2020 requiring prolonged ICU stay at [facility], hypothyroidism, and prediabetes, who presents for follow-up of tachycardia and shortness of breath.  I met him in late February, at which time he was noted to have sinus tachycardia.  Subsequent echo showed normal LVEF with mild LVH and grade 1 diastolic dysfunction.  RV was mildly enlarged.  Today, Brad Lewis reports that he continues to feel a little better every day.  He notes some continued shortness of breath as well as intermittent elevated heart rates.  Home resting heart rates are typically similar to today's (90 bpm).  He has experienced occasional central and right-sided chest pain when he exerts himself.  This is brief, usually only lasting 15 to 20 seconds when he stops.  He notes associated shortness of breath.  He denies orthopnea, PND, lightheadedness, and dizziness.  --------------------------------------------------------------------------------------------------  Cardiovascular History & Procedures: Cardiovascular Problems:  Dyspnea on exertion following COVID-19  Sinus tachycardia  Chest pain  Risk Factors:  Prediabetes, male gender, obesity, prior tobacco use, and age greater than 46  Cath/PCI:  None  CV Surgery:  None  EP Procedures and Devices:  None  Non-Invasive Evaluation(s):  TTE (11/21/2020): Normal LV size with mild LVH.  LVEF 55-60% with grade 1 diastolic dysfunction.  GLS -15.4%.  Mild right ventricular enlargement with normal wall thickness and contraction.  Normal biatrial size.  No significant valvular abnormality.  No pericardial effusion.  Normal CVP.   Recent CV Pertinent Labs: Lab Results  Component Value Date    INR 0.9 07/18/2013   K 3.5 07/18/2013   BUN 12 07/18/2013   CREATININE 0.99 07/18/2013    Past medical and surgical history were reviewed and updated in EPIC.  Current Meds  Medication Sig  . levothyroxine (SYNTHROID) 88 MCG tablet Take 88 mcg by mouth daily before breakfast.  . metoprolol succinate (TOPROL XL) 25 MG 24 hr tablet Take 1 tablet (25 mg total) by mouth daily.  . metoprolol tartrate (LOPRESSOR) 100 MG tablet Take 1 tablet (136m) to be taken 2 hours prior to a Cardiac CTA  . Multiple Vitamin (ONE-A-DAY MENS PO) Take by mouth daily.    Allergies: Patient has no known allergies.  Social History   Tobacco Use  . Smoking status: Former SResearch scientist (life sciences) . Smokeless tobacco: Never Used  . Tobacco comment: quit 10-15 yrs ago  Vaping Use  . Vaping Use: Never used  Substance Use Topics  . Alcohol use: Never  . Drug use: Never    Family History  Problem Relation Age of Onset  . Dementia Mother   . Suicidality Father   . Multiple sclerosis Sister   . Heart disease Neg Hx     Review of Systems: A 12-system review of systems was performed and was negative except as noted in the HPI.  --------------------------------------------------------------------------------------------------  Physical Exam: BP 120/80 (BP Location: Left Arm, Patient Position: Sitting, Cuff Size: Large)   Pulse 90   Ht 6' 1"  (1.854 m)   Wt 261 lb (118.4 kg)   SpO2 97%   BMI 34.43 kg/m   General:  NAD. Neck: No JVD or HJR. Lungs: Clear to auscultation bilaterally without wheezes or crackles. Heart: Regular rate and rhythm without murmurs, rubs, or gallops.  Abdomen: Soft, nontender, nondistended. Extremities: No lower extremity edema.  EKG: Normal sinus rhythm without abnormality.  Lab Results  Component Value Date   WBC 6.9 07/18/2013   HGB 14.6 07/18/2013   HCT 41.9 07/18/2013   MCV 89 07/18/2013   PLT 255 07/18/2013    Lab Results  Component Value Date   NA 140 07/18/2013   K  3.5 07/18/2013   CL 107 07/18/2013   CO2 27 07/18/2013   BUN 12 07/18/2013   CREATININE 0.99 07/18/2013   GLUCOSE 136 (H) 07/18/2013    No results found for: CHOL, HDL, LDLCALC, LDLDIRECT, TRIG, CHOLHDL  --------------------------------------------------------------------------------------------------  ASSESSMENT AND PLAN: Chest pain and shortness of breath: Chronic dyspnea following COVID-19 infection in 08/2020 is gradually improving.  His chest pain could be related to a pulmonary process, though given its exertional quality, we have agreed to exclude ischemic heart disease with a coronary CTA.  This will also allow [place] to better evaluate his lung parenchyma for signs of scarring related to his severe COVID-19 infection last year.  In the meantime, we will add metoprolol succinate 25 mg daily for antianginal therapy.  Sinus tachycardia: Heart rate improved today.  We will add metoprolol succinate 25 mg daily and proceed with cardiac CTA.  Recent echo showed preserved LVEF.  RV was mildly dilated.  If his dyspnea does not resolve, we may need to investigate further for underlying pulmonary hypertension, as PA pressures could not be estimated by echo, particularly if there is evidence of PA enlargement or parenchymal lung disease on CTA.  Follow-up: Return to clinic in 3 months.  Nelva Bush, MD 01/05/2021 1:05 PM

## 2021-01-05 ENCOUNTER — Encounter: Payer: Self-pay | Admitting: Internal Medicine

## 2021-01-05 DIAGNOSIS — R079 Chest pain, unspecified: Secondary | ICD-10-CM | POA: Insufficient documentation

## 2021-01-22 ENCOUNTER — Telehealth (HOSPITAL_COMMUNITY): Payer: Self-pay | Admitting: Emergency Medicine

## 2021-01-22 NOTE — Telephone Encounter (Signed)
Reaching out to patient to offer assistance regarding upcoming cardiac imaging study; pt verbalizes understanding of appt date/time, parking situation and where to check in, pre-test NPO status and medications ordered, and verified current allergies; name and call back number provided for further questions should they arise Rockwell Alexandria RN Navigator Cardiac Imaging [facility] [phone] office [phone] cell   100mg  metoprolol tartrate 2 hr prior to scan Reminded to get labs today. 

## 2021-01-23 ENCOUNTER — Other Ambulatory Visit
Admission: RE | Admit: 2021-01-23 | Discharge: 2021-01-23 | Disposition: A | Discharge status: Home / Self Care | Payer: BC Managed Care – PPO | Attending: Internal Medicine | Admitting: Internal Medicine

## 2021-01-23 ENCOUNTER — Other Ambulatory Visit: Payer: Self-pay

## 2021-01-23 ENCOUNTER — Telehealth: Payer: Self-pay

## 2021-01-23 DIAGNOSIS — Z01812 Encounter for preprocedural laboratory examination: Secondary | ICD-10-CM | POA: Diagnosis not present

## 2021-01-23 LAB — BASIC METABOLIC PANEL
Anion gap: 9 (ref 5–15)
BUN: 18 mg/dL (ref 6–20)
CO2: 26 mmol/L (ref 22–32)
Calcium: 9.3 mg/dL (ref 8.9–10.3)
Chloride: 103 mmol/L (ref 98–111)
Creatinine, Ser: 1.16 mg/dL (ref 0.61–1.24)
GFR, Estimated: >60 mL/min (ref >60)
Glucose, Bld: 107 mg/dL — ABNORMAL HIGH (ref 70–99)
Potassium: 4.5 mmol/L (ref 3.5–5.1)
Sodium: 138 mmol/L (ref 135–145)

## 2021-01-23 NOTE — Telephone Encounter (Signed)
Able to reach pt regarding his recent lab work from today, Dr. Okey Dupre had a chance to review his results and advised   "No significant abnormality on BMP. Ok to proceed with procedure, as planned."  Mr. Brad Lewis is very grateful for quick turn around time on results call and will proceed with cardiac CTA as schedule tomorrow.

## 2021-01-24 ENCOUNTER — Ambulatory Visit
Admission: RE | Admit: 2021-01-24 | Discharge: 2021-01-24 | Disposition: A | Discharge status: Home / Self Care | Payer: BC Managed Care – PPO | Source: Ambulatory Visit | Attending: Internal Medicine | Admitting: Internal Medicine

## 2021-01-24 DIAGNOSIS — R0602 Shortness of breath: Secondary | ICD-10-CM | POA: Diagnosis not present

## 2021-01-24 DIAGNOSIS — R072 Precordial pain: Secondary | ICD-10-CM | POA: Diagnosis not present

## 2021-01-24 DIAGNOSIS — R079 Chest pain, unspecified: Secondary | ICD-10-CM | POA: Diagnosis not present

## 2021-01-24 MED ORDER — NITROGLYCERIN 0.4 MG SL SUBL
0.8000 mg | SUBLINGUAL_TABLET | Freq: Once | SUBLINGUAL | Status: AC
  Administered 2021-01-24: 0.8 mg via SUBLINGUAL

## 2021-01-24 MED ORDER — METOPROLOL TARTRATE 5 MG/5ML IV SOLN
10.0000 mg | Freq: Once | INTRAVENOUS | Status: AC
  Administered 2021-01-24: 10 mg via INTRAVENOUS

## 2021-01-24 MED ORDER — IOHEXOL 350 MG/ML SOLN
100.0000 mL | Freq: Once | INTRAVENOUS | Status: AC | PRN
  Administered 2021-01-24: 100 mL via INTRAVENOUS

## 2021-01-24 NOTE — Progress Notes (Signed)
Pt tolerated exam without incident; pt denies lightheadedness or dizziness; pt ambulatory to lobby steady gait noted  

## 2021-01-25 ENCOUNTER — Telehealth: Payer: Self-pay

## 2021-01-25 DIAGNOSIS — R0609 Other forms of dyspnea: Secondary | ICD-10-CM

## 2021-01-25 DIAGNOSIS — R0600 Dyspnea, unspecified: Secondary | ICD-10-CM

## 2021-01-25 DIAGNOSIS — J8410 Pulmonary fibrosis, unspecified: Secondary | ICD-10-CM

## 2021-01-25 NOTE — Telephone Encounter (Signed)
-----   Message from Yvonne Kendall, MD sent at 01/24/2021  4:12 PM EDT ----- Please let Brad Lewis know that his coronary CTA showed no significant coronary artery disease/blockage.  The visualized portion of his lungs had findings that could be seen with early fibrosis or scarring.  If he does not already have a lung doctor, I recommend that we refer him to pulmonology for further evaluation.

## 2021-01-25 NOTE — Telephone Encounter (Signed)
The patient has been notified of the result and verbalized understanding.  All questions (if any) were answered. Theresia Majors, RN 01/25/2021 8:54 AM  Referral has been placed.

## 2021-02-05 ENCOUNTER — Other Ambulatory Visit: Payer: Self-pay | Admitting: Family Medicine

## 2021-02-05 DIAGNOSIS — R1011 Right upper quadrant pain: Secondary | ICD-10-CM

## 2021-02-20 ENCOUNTER — Other Ambulatory Visit: Payer: Self-pay

## 2021-02-20 ENCOUNTER — Encounter: Payer: Self-pay | Admitting: Emergency Medicine

## 2021-02-20 ENCOUNTER — Ambulatory Visit: Payer: BC Managed Care – PPO | Admitting: Emergency Medicine

## 2021-02-20 VITALS — BP 130/82 | HR 88 | Temp 97.6°F | Ht 73.0 in | Wt 260.0 lb

## 2021-02-20 DIAGNOSIS — R0602 Shortness of breath: Secondary | ICD-10-CM

## 2021-02-20 DIAGNOSIS — J849 Interstitial pulmonary disease, unspecified: Secondary | ICD-10-CM | POA: Diagnosis not present

## 2021-02-20 NOTE — Assessment & Plan Note (Signed)
Almost certainly due in large part to residual from his COVID-19 pneumonia.  He does have parenchymal changes on his cardiac CT that would support this.  Minimal tobacco history but need to evaluate baseline lung function, evaluate for possible obstruction with full PFT.  I will also repeat his CT chest, get high-resolution cuts to assess degree of interstitial disease.  He will probably benefit going forward from continued PT, cardiopulmonary conditioning.  We will perform a walking oximetry today to ensure that he does not have occult desaturation.  He has not seen any desaturations at home. He is at  risk for thromboembolic disease given his COVID-19, his job as a Naval architect.  I talked to him about ensuring that he has adequate mobility, does not take prolonged trips. Finally given his slightly enlarged RV need to consider the possibility of OSA.  He does not have witnessed apneas, does not snore.  I will hold off on PSG for now but we could consider going forward.

## 2021-02-20 NOTE — Progress Notes (Signed)
Subjective:    Patient ID: Brad Lewis, male    DOB: 1966/06/21, 55 y.o.   MRN: [id number]  HPI 55 year old former smoker (4-5 pk-yrs) with a history of hypothyroidism and prediabetes.  He had severe COVID-19 requiring ICU stay at [facility] in 08/2020.  He has been dealing with subsequent dyspnea.  Cardiology evaluation showed normal LVEF with some mild LVH and grade 1 diastolic dysfunction, mild RV enlargement.  He has had sinus tachycardia, started on metoprolol in late April.  With regard to his COVID 34 he was admitted 08/16/2020 with hypoxemic respiratory failure, required 1.00 FiO2.  He received remdesivir, dexamethasone, Tocilizumab.  He never required intubation or mechanical ventilation.  He was slowly weaned and was requiring 2 L/min at the time of discharge, was able to come off.   Today he reports that his breathing did improve over time with exercise, time, PT. He still has residual exertional SOB especially w stairs. He hasn't seen any desats on RA even w exertion. Before COVID he had a great functional capacity, now limited - he can work, difficulty doing chores at home. Can do for a few hours. No cough now. No wheeze. He has lost about 30 lbs through the illness.  He does not snore. No witnessed apneas. No napping. Feels well rested in am. Sleeps 7h a night.   CT coronary chest 01/24/2021 reviewed by me, shows bilateral patchy areas of mild peripheral predominant groundglass attenuation with some associated septal thickening.   Echo 11/21/20 shows normal EF, some diastolic dysfunction and LVH, normal valves, slightly enlarged RV but normal RV function.   Review of Systems As per HPI  Past Medical History:  Diagnosis Date   COVID-19 08/2020   Hypothyroidism    Prediabetes      Family History  Problem Relation Age of Onset   Dementia Mother    Suicidality Father    Multiple sclerosis Sister    Heart disease Neg Hx      Social History   Socioeconomic History   Marital  status: Single    Spouse name: Not on file   Number of children: Not on file   Years of education: Not on file   Highest education level: Not on file  Occupational History   Not on file  Tobacco Use   Smoking status: Former    Packs/day: 0.50    Years: 30.00    Pack years: 15.00    Types: Cigarettes   Smokeless tobacco: Never   Tobacco comments:    Quit 10-15 years ago per patient 02/20/21 [facility].  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   No Known Allergies   Outpatient Medications Prior to Visit  Medication Sig Dispense Refill   levothyroxine (SYNTHROID) 88 MCG tablet Take 88 mcg by mouth daily before breakfast.     metoprolol succinate (TOPROL XL)  25 MG 24 hr tablet Take 1 tablet (25 mg total) by mouth daily. 30 tablet 5   Multiple Vitamin (ONE-A-DAY MENS PO) Take by mouth daily.     pregabalin (LYRICA) 50 MG capsule Take 50 mg by mouth daily.     metoprolol tartrate (LOPRESSOR) 100 MG tablet Take 1 tablet (100mg ) to be taken 2 hours prior to a Cardiac CTA (Patient not taking: Reported on 02/20/2021) 1 tablet 0   No facility-administered medications prior to visit.         Objective:   Physical Exam Vitals:   02/20/21 1108  BP: 130/82  Pulse: 88  Temp: 97.6 F (36.4 C)  TempSrc: Temporal  SpO2: 97%  Weight: 260 lb (117.9 kg)  Height: 6\' 1"  (1.854 m)   Gen: Pleasant, overwt man, in no distress,  normal affect  ENT: No lesions,  mouth clear,   oropharynx clear, no postnasal drip  Neck: No JVD, no stridor  Lungs: No use of accessory muscles, no crackles or wheezing on normal respiration, no wheeze on forced expiration  Cardiovascular: RRR, heart sounds normal, no murmur or gallops, no peripheral edema  Musculoskeletal: No deformities, no cyanosis or clubbing  Neuro: alert, awake, non focal  Skin: Warm, no lesions or rash      Assessment & Plan:   Shortness of breath Almost certainly due in large part to residual from his COVID-19 pneumonia.  He does have parenchymal changes on his cardiac CT that would support this.  Minimal tobacco history but need to evaluate baseline lung function, evaluate for possible obstruction with full PFT.  I will also repeat his CT chest, get high-resolution cuts to assess degree of interstitial disease.  He will probably benefit going forward from continued PT, cardiopulmonary conditioning.  We will perform a walking oximetry today to ensure that he does not have occult desaturation.  He has not seen any desaturations at home. 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  I talked to him about ensuring that he has adequate mobility, does not take prolonged trips. Finally given his slightly enlarged RV need to consider the possibility of OSA.  He does not have witnessed apneas, does not snore.  I will hold off on PSG for now but we could consider going forward.   , MD, PhD 02/20/2021, 11:39 AM [facility] [phone] or if no answer before 7:00PM call [phone] For any issues after 7:00PM please call eLink [phone]

## 2021-02-20 NOTE — Patient Instructions (Addendum)
We will arrange for high-resolution CT scan of the chest. We will perform pulmonary function testing. We will repeat your walking oximetry today on room air. Follow with Dr Delton Coombes next available with full PFT on the same day

## 2021-02-20 NOTE — Addendum Note (Signed)
Addended by: Dorisann Frames R on: 02/20/2021 11:55 AM   Modules accepted: Orders

## 2021-03-04 ENCOUNTER — Other Ambulatory Visit: Payer: Self-pay

## 2021-03-04 ENCOUNTER — Ambulatory Visit
Admission: RE | Admit: 2021-03-04 | Discharge: 2021-03-04 | Disposition: A | Discharge status: Home / Self Care | Payer: BC Managed Care – PPO | Source: Ambulatory Visit | Attending: Emergency Medicine | Admitting: Emergency Medicine

## 2021-03-04 DIAGNOSIS — J849 Interstitial pulmonary disease, unspecified: Secondary | ICD-10-CM | POA: Diagnosis not present

## 2021-04-09 ENCOUNTER — Ambulatory Visit: Payer: BC Managed Care – PPO | Admitting: Emergency Medicine

## 2021-04-09 ENCOUNTER — Ambulatory Visit (INDEPENDENT_AMBULATORY_CARE_PROVIDER_SITE_OTHER): Payer: BC Managed Care – PPO | Admitting: Emergency Medicine

## 2021-04-09 ENCOUNTER — Encounter: Payer: Self-pay | Admitting: Emergency Medicine

## 2021-04-09 ENCOUNTER — Other Ambulatory Visit: Payer: Self-pay

## 2021-04-09 DIAGNOSIS — R0602 Shortness of breath: Secondary | ICD-10-CM

## 2021-04-09 DIAGNOSIS — J849 Interstitial pulmonary disease, unspecified: Secondary | ICD-10-CM

## 2021-04-09 LAB — PULMONARY FUNCTION TEST
DL/VA % pred: 115 %
DL/VA: 4.92 ml/min/mmHg/L
DLCO cor % pred: 86 %
DLCO cor: 27.1 ml/min/mmHg
DLCO unc % pred: 86 %
DLCO unc: 27.1 ml/min/mmHg
FEF 25-75 Post: 4.7 L/sec
FEF 25-75 Pre: 4.09 L/sec
FEF2575-%Change-Post: 15 %
FEF2575-%Pred-Post: 133 %
FEF2575-%Pred-Pre: 116 %
FEV1-%Change-Post: 4 %
FEV1-%Pred-Post: 84 %
FEV1-%Pred-Pre: 80 %
FEV1-Post: 3.52 L
FEV1-Pre: 3.36 L
FEV1FVC-%Change-Post: 0 %
FEV1FVC-%Pred-Pre: 111 %
FEV6-%Change-Post: 4 %
FEV6-%Pred-Post: 78 %
FEV6-%Pred-Pre: 75 %
FEV6-Post: 4.11 L
FEV6-Pre: 3.94 L
FEV6FVC-%Change-Post: 0 %
FEV6FVC-%Pred-Post: 103 %
FEV6FVC-%Pred-Pre: 103 %
FVC-%Change-Post: 4 %
FVC-%Pred-Post: 75 %
FVC-%Pred-Pre: 72 %
FVC-Post: 4.12 L
FVC-Pre: 3.95 L
Post FEV1/FVC ratio: 85 %
Post FEV6/FVC ratio: 100 %
Pre FEV1/FVC ratio: 85 %
Pre FEV6/FVC Ratio: 100 %
RV % pred: 90 %
RV: 2.07 L
TLC % pred: 79 %
TLC: 6.03 L

## 2021-04-09 NOTE — Assessment & Plan Note (Signed)
He has mild interstitial changes and mild scattered regional groundglass associated with his prior COVID-19 pneumonia.  I suspect that these changes have improved is much as they are going to and will continue to be limiting.  His pulmonary function testing is consistent with mild restriction, no evidence of obstruction.  I believe the best way for him to improve his functional capacity is to continue to work on his cardiopulmonary conditioning.  Hopefully he can overcome the lung function that he has lost due to his COVID illness.  There are rare instances in which ILD associated with COVID progresses over time.  I will follow with him if he has any clinical decline or in 1 year.  If his breathing is worse then we will probably repeat a CT scan of the chest to ensure stability.

## 2021-04-09 NOTE — Progress Notes (Signed)
Subjective:    Patient ID: Brad Lewis, male    DOB: 06-Mar-1966, 55 y.o.   MRN: [id number]  HPI 55 year old former smoker (4-5 pk-yrs) with a history of hypothyroidism and prediabetes.  He had severe COVID-19 requiring ICU stay at [facility] in 08/2020.  He has been dealing with subsequent dyspnea.  Cardiology evaluation showed normal LVEF with some mild LVH and grade 1 diastolic dysfunction, mild RV enlargement.  He has had sinus tachycardia, started on metoprolol in late April.  With regard to his COVID 78 he was admitted 08/16/2020 with hypoxemic respiratory failure, required 1.00 FiO2.  He received remdesivir, dexamethasone, Tocilizumab.  He never required intubation or mechanical ventilation.  He was slowly weaned and was requiring 2 L/min at the time of discharge, was able to come off.   Today he reports that his breathing did improve over time with exercise, time, PT. He still has residual exertional SOB especially w stairs. He hasn't seen any desats on RA even w exertion. Before COVID he had a great functional capacity, now limited - he can work, difficulty doing chores at home. Can do for a few hours. No cough now. No wheeze. He has lost about 30 lbs through the illness.  He does not snore. No witnessed apneas. No napping. Feels well rested in am. Sleeps 7h a night.   CT coronary chest 01/24/2021 reviewed by me, shows bilateral patchy areas of mild peripheral predominant groundglass attenuation with some associated septal thickening.   Echo 11/21/20 shows normal EF, some diastolic dysfunction and LVH, normal valves, slightly enlarged RV but normal RV function.   ROV 04/09/21 --follow-up visit for dyspnea in a gentleman with a history of severe COVID-19 pneumonia and ARDS.  He also has grade 1 diastolic dysfunction.  He was weaned off oxygen at the time of discharge, did not desaturate at his last office visit. He may be slightly better, but he still cannot do his pre-COVID activities as well. No  cough or wheeze.   High-resolution CT scan of the chest performed 03/04/2021 reviewed by me, shows mild irregular peripheral interstitial and groundglass opacities with some subpleural scarring with no significant change compared with 01/24/2021  PFT performed today and reviewed by me, show evidence for restriction on spirometry without a bronchodilator response, also mild restriction on lung volumes with a normal diffusion capacity.   Review of Systems As per HPI       Objective:   Physical Exam Vitals:   04/09/21 1604  BP: 114/70  Pulse: 81  Temp: 97.8 F (36.6 C)  TempSrc: Oral  SpO2: 97%  Weight: 257 lb 3.2 oz (116.7 kg)  Height: 6\' 1"  (1.854 m)   Gen: Pleasant, overwt man, in no distress,  normal affect  ENT: No lesions,  mouth clear,  oropharynx clear, no postnasal drip  Neck: No JVD, no stridor  Lungs: No use of accessory muscles, no crackles or wheezing on normal respiration, no wheeze on forced expiration  Cardiovascular: RRR, heart sounds normal, no murmur or gallops, no peripheral edema  Musculoskeletal: No deformities, no cyanosis or clubbing  Neuro: alert, awake, non focal  Skin: Warm, no lesions or rash      Assessment & Plan:   Shortness of breath He has mild interstitial changes and mild scattered regional groundglass associated with his prior COVID-19 pneumonia.  I suspect that these changes have improved is much as they are going to and will continue to be limiting.  His pulmonary function testing is  consistent with mild restriction, no evidence of obstruction.  I believe the best way for him to improve his functional capacity is to continue to work on his cardiopulmonary conditioning.  Hopefully he can overcome the lung function that he has lost due to his COVID illness.  There are rare instances in which ILD associated with COVID progresses over time.  I will follow with him if he has any clinical decline or in 1 year.  If his breathing is worse then  we will probably repeat a CT scan of the chest to ensure stability.  Time spent 30 minutes  Levy Pupa, MD, PhD 04/09/2021, 4:30 PM [facility] [phone] or if no answer before 7:00PM call [phone] For any issues after 7:00PM please call eLink [phone]

## 2021-04-09 NOTE — Patient Instructions (Signed)
Your CT scan of the chest shows the residual changes from her COVID-19 pneumonia with some mild scar present.  There has been no interval progression compared with your previous scan Your pulmonary function testing shows a very slight restriction on the sides of each breath that you take.  This could be associated with residual COVID inflammation or scar. No indication for inhaled medications at this time. Please continue to work on building up your stamina, cardiopulmonary conditioning.  This is probably the best way to improve your functional capacity.  I believe that your breathing can improve even though there are residual changes from your COVID that may not resolve. Follow-up with Dr. Delton Coombes in 1 year or sooner if you have any progression of breathing problems.

## 2021-04-09 NOTE — Progress Notes (Signed)
PFT done today. 

## 2021-04-10 ENCOUNTER — Ambulatory Visit: Payer: BC Managed Care – PPO | Admitting: Internal Medicine

## 2021-04-29 DIAGNOSIS — Z0189 Encounter for other specified special examinations: Secondary | ICD-10-CM | POA: Diagnosis not present

## 2021-05-01 DIAGNOSIS — E039 Hypothyroidism, unspecified: Secondary | ICD-10-CM | POA: Diagnosis not present

## 2021-05-01 DIAGNOSIS — E785 Hyperlipidemia, unspecified: Secondary | ICD-10-CM | POA: Diagnosis not present

## 2021-05-01 DIAGNOSIS — Z043 Encounter for examination and observation following other accident: Secondary | ICD-10-CM | POA: Diagnosis not present

## 2021-05-30 IMAGING — CT CT HEART MORP W/ CTA COR W/ SCORE W/ CA W/CM &/OR W/O CM
2 of 13 series · 5 of 20 positions shown, 6 images · non-contrast
Comparison: No priors.

Addendum:
CLINICAL DATA: Chest pain, dyspnea on exertion

EXAM:
Cardiac/Coronary  CTA
TECHNIQUE: The patient was scanned on a Siemens Somatom go.Top scanner.

[Series 27: multiphase % cta coronary 0.60 · axial · 0.37mm/px · z∈[-1101,-1038]mm · 3 of 3150 slices shown, 4 images]
[im 788/3150  vessel]
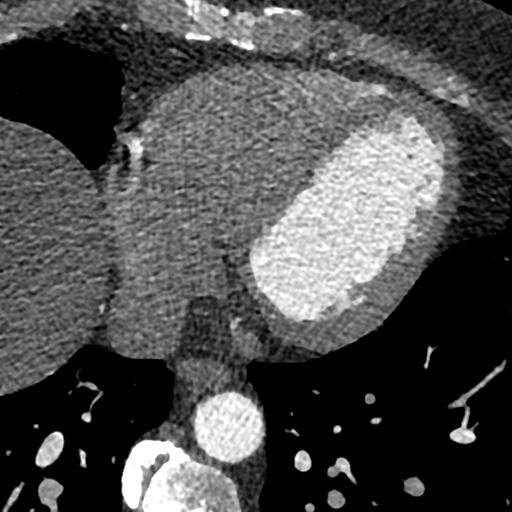
[im 788/3150  lung]
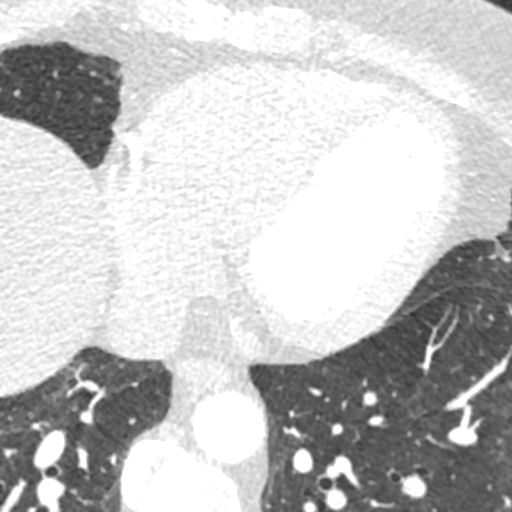
[im 1575/3150  vessel]
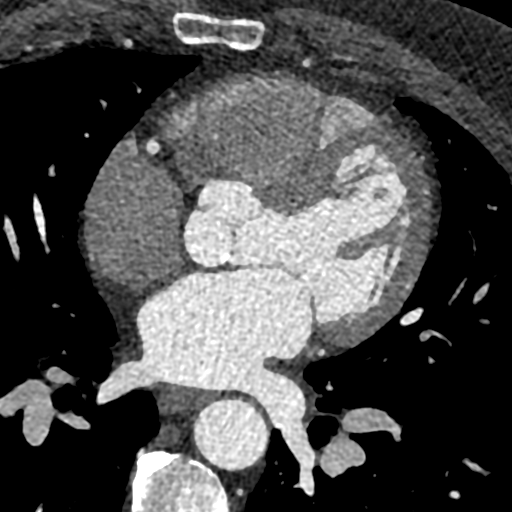
[im 2362/3150  vessel]
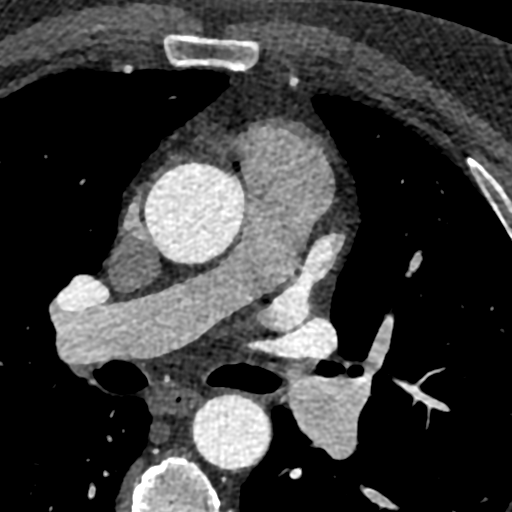

[Series 40: ms multiphase cta coronary 0.60 · axial · 0.37mm/px · z∈[-1091,-1049]mm · 2 of 2520 slices shown]
[im 840/2520  vessel]
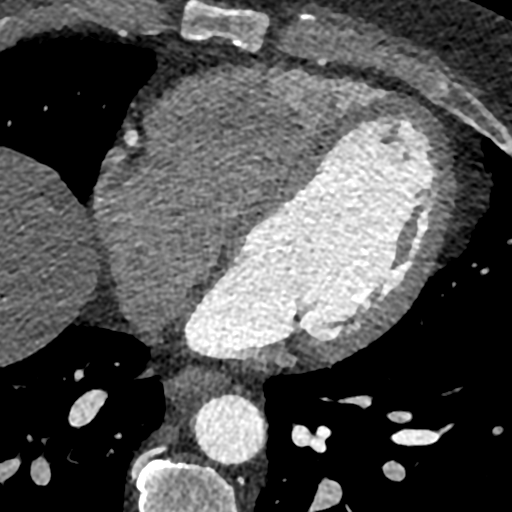
[im 1680/2520  vessel]
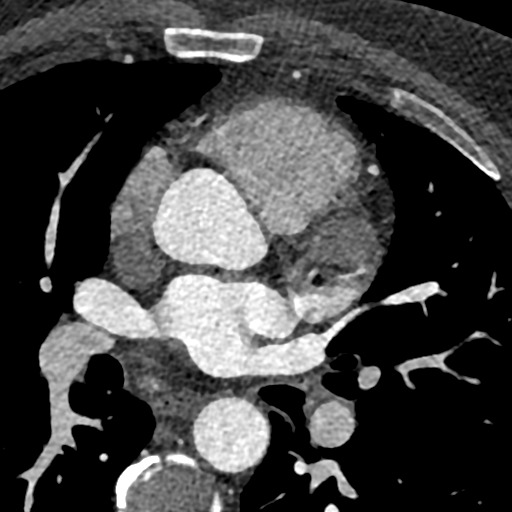

[5 of 20 positions shown; findings below may reference images not displayed]



Aortic Valve:  Trileaflet.  No calcifications.

Coronary Arteries:  Normal coronary origin.  Right dominance.

RCA is a large dominant artery that gives rise to PDA and PLA. There
is no plaque.

Left main gives rise to LAD and LCX arteries. No LM disease is
noted.

LAD  has no plaque.

LCX is a non-dominant artery that gives rise to one small OM1
branch. There is no plaque.

Other findings:

Normal pulmonary vein drainage into the left atrium.

Normal left atrial appendage without a thrombus.

Normal size of the pulmonary artery.
IMPRESSION: 1. Normal coronary calcium score of 0. Patient is low risk for
coronary events.

2. Normal coronary origin with right dominance.

3. No evidence of CAD.

4. CAD-RADS 0. Consider non-atherosclerotic causes of chest pain.

EXAM:
OVER-READ INTERPRETATION  CT CHEST

The following report is an over-read performed by radiologist Dr.
over-read does not include interpretation of cardiac or coronary
anatomy or pathology. The coronary calcium score and cardiac CTA
interpretation by the cardiologist is attached.
FINDINGS: Patchy areas of mild peripheral predominant ground-glass attenuation
and septal thickening are noted in the lungs bilaterally. Within the
visualized portions of the thorax there are no suspicious appearing
pulmonary nodules or masses, there is no acute consolidative
airspace disease, no pleural effusions, no pneumothorax and no
lymphadenopathy. Visualized portions of the upper abdomen are
unremarkable. There are no aggressive appearing lytic or blastic
lesions noted in the visualized portions of the skeleton.
IMPRESSION: 1. Findings in the lungs which could indicate very early or mild
interstitial lung disease. Outpatient referral to Pulmonology for
further clinical evaluation is recommended, as well as consideration
for follow-up nonemergent high-resolution chest CT if clinically
appropriate.



Aortic Valve:  Trileaflet.  No calcifications.

Coronary Arteries:  Normal coronary origin.  Right dominance.

RCA is a large dominant artery that gives rise to PDA and PLA. There
is no plaque.

Left main gives rise to LAD and LCX arteries. No LM disease is
noted.

LAD  has no plaque.

LCX is a non-dominant artery that gives rise to one small OM1
branch. There is no plaque.

Other findings:

Normal pulmonary vein drainage into the left atrium.

Normal left atrial appendage without a thrombus.

Normal size of the pulmonary artery.
IMPRESSION: 1. Normal coronary calcium score of 0. Patient is low risk for
coronary events.

2. Normal coronary origin with right dominance.

3. No evidence of CAD.

4. CAD-RADS 0. Consider non-atherosclerotic causes of chest pain.

## 2021-06-13 ENCOUNTER — Other Ambulatory Visit: Payer: Self-pay

## 2021-06-13 ENCOUNTER — Ambulatory Visit: Payer: BC Managed Care – PPO | Admitting: Internal Medicine

## 2021-06-13 ENCOUNTER — Encounter: Payer: Self-pay | Admitting: Internal Medicine

## 2021-06-13 VITALS — BP 108/80 | HR 87 | Ht 73.0 in | Wt 255.0 lb

## 2021-06-13 DIAGNOSIS — R0609 Other forms of dyspnea: Secondary | ICD-10-CM | POA: Diagnosis not present

## 2021-06-13 DIAGNOSIS — U099 Post covid-19 condition, unspecified: Secondary | ICD-10-CM | POA: Diagnosis not present

## 2021-06-13 DIAGNOSIS — R000 Tachycardia, unspecified: Secondary | ICD-10-CM

## 2021-06-13 DIAGNOSIS — J849 Interstitial pulmonary disease, unspecified: Secondary | ICD-10-CM | POA: Diagnosis not present

## 2021-06-13 DIAGNOSIS — Z23 Encounter for immunization: Secondary | ICD-10-CM | POA: Diagnosis not present

## 2021-06-13 MED ORDER — METOPROLOL SUCCINATE ER 25 MG PO TB24: 25.0000 mg | ORAL_TABLET | Freq: Every day | ORAL | 3 refills | Status: DC

## 2021-06-13 NOTE — Patient Instructions (Signed)
Medication Instructions:   Your physician recommends that you continue on your current medications as directed. Please refer to the Current Medication list given to you today.  We have sent refills for your Metoprolol today.   *If you need a refill on your cardiac medications before your next appointment, please call your pharmacy*   Lab Work:  None ordered  Testing/Procedures:  None ordered   Follow-Up: At [facility], you and your health needs are our priority.  As part of our continuing mission to provide you with exceptional heart care, we have created designated Provider Care Teams.  These Care Teams include your primary Cardiologist (physician) and Advanced Practice Providers (APPs -  Physician Assistants and Nurse Practitioners) who all work together to provide you with the care you need, when you need it.  We recommend signing up for the patient portal called "MyChart".  Sign up information is provided on this After Visit Summary.  MyChart is used to connect with patients for Virtual Visits (Telemedicine).  Patients are able to view lab/test results, encounter notes, upcoming appointments, etc.  Non-urgent messages can be sent to your provider as well.   To learn more about what you can do with MyChart, go to [email or web address].    Your next appointment:   1 year(s)  The format for your next appointment:   In Person  Provider:   You may see Dr. Cristal Deer End or one of the following Advanced Practice Providers on your designated Care Team:   Nicolasa Ducking, NP Eula Listen, PA-C Marisue Ivan, PA-C Cadence [place], [place]

## 2021-06-13 NOTE — Progress Notes (Signed)
Follow-up Outpatient Visit Date: 06/13/2021  Primary Care Provider: Lorn Junes, FNP [address] [place] [address]  Chief Complaint: Follow-up shortness of breath  HPI:  Brad Lewis is a 55 y.o. male with history of severe COVID-19 infection in 08/2020 requiring prolonged ICU stay at [facility], hypothyroidism, and prediabetes, who presents for follow-up of shortness of breath and tachycardia.  I last saw him in late April, at which time Mr. Liz reported that he was continuing to feel better.  He still had some shortness of breath and tachycardia at times as well as occasional central and right-sided chest pain with exertion.  We agreed to obtain a coronary CTA and add low-dose metoprolol.  CTA showed no coronary artery disease.  Visualized lungs demonstrated findings of very early or mild interstitial lung disease.  He has been following with Dr. Delton Coombes in the pulmonary clinic.  Today, Mr. Franchini reports stable exertional dyspnea.  He has been trying to increase his activity more with the hope of improving his stamina and respiratory function.  He still notes occasional right-sided chest pain when he is out of breath exerting himself.  He denies palpitations, lightheadedness, and edema.  He reports that his heart rates tend to be a little bit lower with metoprolol, which also helps him feel better.  He has not had any side effects from this medication.  --------------------------------------------------------------------------------------------------  Cardiovascular History & Procedures: Cardiovascular Problems: Dyspnea on exertion following COVID-19 Sinus tachycardia Chest pain   Risk Factors: Prediabetes, male gender, obesity, prior tobacco use, and age greater than 16   Cath/PCI: None   CV Surgery: None   EP Procedures and Devices: None   Non-Invasive Evaluation(s): Coronary CTA (01/24/2021): Normal coronary arteries without calcification or CAD.  Visualized lungs  demonstrate findings that could indicate very early or mild interstitial lung disease. TTE (11/21/2020): Normal LV size with mild LVH.  LVEF 55-60% with grade 1 diastolic dysfunction.  GLS -15.4%.  Mild right ventricular enlargement with normal wall thickness and contraction.  Normal biatrial size.  No significant valvular abnormality.  No pericardial effusion.  Normal CVP.  Recent CV Pertinent Labs: Lab Results  Component Value Date   INR 0.9 07/18/2013   K 4.5 01/23/2021   K 3.5 07/18/2013   BUN 18 01/23/2021   BUN 12 07/18/2013   CREATININE 1.16 01/23/2021   CREATININE 0.99 07/18/2013    Past medical and surgical history were reviewed and updated in EPIC.  Current Meds  Medication Sig   levothyroxine (SYNTHROID) 100 MCG tablet Take 100 mcg by mouth daily.   metoprolol succinate (TOPROL XL) 25 MG 24 hr tablet Take 1 tablet (25 mg total) by mouth daily.   Multiple Vitamin (ONE-A-DAY MENS PO) Take by mouth daily.    Allergies: Patient has no known allergies.  Social History   Tobacco Use   Smoking status: Former    Packs/day: 0.50    Years: 30.00    Pack years: 15.00    Types: Cigarettes   Smokeless tobacco: Never   Tobacco comments:    Quit 10-15 years ago per patient 02/20/21 [facility].  Vaping Use   Vaping Use: Never used  Substance Use Topics   Alcohol use: Never   Drug use: Never    Family History  Problem Relation Age of Onset   Dementia Mother    Suicidality Father    Multiple sclerosis Sister    Heart disease Neg Hx     Review of Systems: A 12-system  review of systems was performed and was negative except as noted in the HPI.  --------------------------------------------------------------------------------------------------  Physical Exam: BP 108/80 (BP Location: Left Arm, Patient Position: Sitting, Cuff Size: Large)   Pulse 87   Ht 6\' 1"  (1.854 m)   Wt 255 lb (115.7 kg)   SpO2 96%   BMI 33.64 kg/m   General:  NAD. Neck: No JVD or HJR. Lungs: Clear  to auscultation bilaterally without wheezes or crackles. Heart: Regular rate and rhythm without murmurs, rubs, or gallops. Abdomen: Soft, nontender, nondistended. Extremities: No lower extremity edema.  EKG: Normal sinus rhythm without abnormality.  Lab Results  Component Value Date   WBC 6.9 07/18/2013   HGB 14.6 07/18/2013   HCT 41.9 07/18/2013   MCV 89 07/18/2013   PLT 255 07/18/2013    Lab Results  Component Value Date   NA 138 01/23/2021   K 4.5 01/23/2021   CL 103 01/23/2021   CO2 26 01/23/2021   BUN 18 01/23/2021   CREATININE 1.16 01/23/2021   GLUCOSE 107 (H) 01/23/2021    No results found for: CHOL, HDL, LDLCALC, LDLDIRECT, TRIG, CHOLHDL  --------------------------------------------------------------------------------------------------  ASSESSMENT AND PLAN: Dyspnea on exertion and interstitial lung disease: Chronic exertional dyspnea seems to be fairly stable and has improved since COVID-19 infection almost 2 years ago.  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  I encouraged him to increase his activity as tolerated in an effort to improve his cardiovascular stamina.  He should continue following up with Dr. 01/25/2021 as previously arranged.  No further cardiac testing or intervention is recommended at this time, as Mr. Ramcharan does not have any signs of heart failure.  Sinus tachycardia: Heart rates have improved with low-dose metoprolol, which Mr. Schippers is tolerating well.  We will plan to continue this indefinitely.  Follow-up: Return to clinic in 1 year  Fenton Foy, MD 06/13/2021 11:39 AM

## 2022-04-15 ENCOUNTER — Encounter: Payer: Self-pay | Admitting: Internal Medicine

## 2022-04-15 DIAGNOSIS — Z0189 Encounter for other specified special examinations: Secondary | ICD-10-CM | POA: Diagnosis not present

## 2022-04-16 ENCOUNTER — Ambulatory Visit (INDEPENDENT_AMBULATORY_CARE_PROVIDER_SITE_OTHER): Payer: BC Managed Care – PPO

## 2022-04-16 ENCOUNTER — Encounter: Payer: Self-pay | Admitting: Emergency Medicine

## 2022-04-16 ENCOUNTER — Ambulatory Visit: Payer: BC Managed Care – PPO | Admitting: Emergency Medicine

## 2022-04-16 DIAGNOSIS — E785 Hyperlipidemia, unspecified: Secondary | ICD-10-CM | POA: Diagnosis not present

## 2022-04-16 DIAGNOSIS — Z713 Dietary counseling and surveillance: Secondary | ICD-10-CM | POA: Diagnosis not present

## 2022-04-16 DIAGNOSIS — J849 Interstitial pulmonary disease, unspecified: Secondary | ICD-10-CM

## 2022-04-16 DIAGNOSIS — Z043 Encounter for examination and observation following other accident: Secondary | ICD-10-CM | POA: Diagnosis not present

## 2022-04-16 DIAGNOSIS — M47814 Spondylosis without myelopathy or radiculopathy, thoracic region: Secondary | ICD-10-CM | POA: Diagnosis not present

## 2022-04-16 NOTE — Assessment & Plan Note (Signed)
Mild residual groundglass confirmed on his CT chest from June 2022.  Overall his functional capacity is stable, he is able to work.  He is no longer on supplemental oxygen.  Minimal cough or other respiratory symptoms.  He does sometimes have fleeting sharp chest discomfort.  No evidence of progression of ILD from COVID at least clinically.  I will check a chest x-ray today.  Given his overall stability I do not think we need to repeat a CT scan of the chest and left chest x-ray suggestive.  As long as his chest x-ray is stable, he will follow-up with me if he has a change in clinical status

## 2022-04-16 NOTE — Addendum Note (Signed)
Addended by: Dorisann Frames R on: 04/16/2022 11:30 AM   Modules accepted: Orders

## 2022-04-16 NOTE — Progress Notes (Signed)
   Subjective:    Patient ID: Brad Lewis, male    DOB: 1966-08-11, 56 y.o.   MRN: [id number]  HPI  ROV 04/09/21 --follow-up visit for dyspnea in a gentleman with a history of severe COVID-19 pneumonia and ARDS.  He also has grade 1 diastolic dysfunction.  He was weaned off oxygen at the time of discharge, did not desaturate at his last office visit. He may be slightly better, but he still cannot do his pre-COVID activities as well. No cough or wheeze.   High-resolution CT scan of the chest performed 03/04/2021 reviewed by me, shows mild irregular peripheral interstitial and groundglass opacities with some subpleural scarring with no significant change compared with 01/24/2021  PFT performed today and reviewed by me, show evidence for restriction on spirometry without a bronchodilator response, also mild restriction on lung volumes with a normal diffusion capacity.  ROV 04/16/2022 --56 year old man with a history of minimal tobacco exposure, hypothyroidism, hypertension with diastolic dysfunction, prediabetes.  He had severe COVID-19 pneumonia with associated ARDS 08/2020.  We identified interstitial disease and some persistent scattered groundglass changes on imaging post COVID.  Last scan was 03/04/2021 He does still feel that breathing can limit him, has to stop after climbing stairs, able to walk. He drives a truck for a living. More trouble in the heat. No cough. Rarely will get some CP's w exertion. No longer on O2.    Review of Systems As per HPI       Objective:   Physical Exam Vitals:   04/16/22 1054  BP: 132/76  Pulse: 72  Temp: 98.1 F (36.7 C)  TempSrc: Oral  SpO2: 99%  Weight: 271 lb 9.6 oz (123.2 kg)  Height: 6\' 1"  (1.854 m)   Gen: Pleasant, overwt man, in no distress,  normal affect  ENT: No lesions,  mouth clear,  oropharynx clear, no postnasal drip  Neck: No JVD, no stridor  Lungs: No use of accessory muscles, no crackles or wheezing on normal respiration, no  wheeze on forced expiration  Cardiovascular: RRR, heart sounds normal, no murmur or gallops, no peripheral edema  Musculoskeletal: No deformities, no cyanosis or clubbing  Neuro: alert, awake, non focal  Skin: Warm, no lesions or rash      Assessment & Plan:   Interstitial lung disease (HCC) Mild residual groundglass confirmed on his CT chest from June 2022.  Overall his functional capacity is stable, he is able to work.  He is no longer on supplemental oxygen.  Minimal cough or other respiratory symptoms.  He does sometimes have fleeting sharp chest discomfort.  No evidence of progression of ILD from COVID at least clinically.  I will check a chest x-ray today.  Given his overall stability I do not think we need to repeat a CT scan of the chest and left chest x-ray suggestive.  As long as his chest x-ray is stable, he will follow-up with me if he has a change in clinical status     July 2022, MD, PhD 04/16/2022, 11:25 AM [facility] [phone] or if no answer before 7:00PM call [phone] For any issues after 7:00PM please call eLink [phone]

## 2022-04-16 NOTE — Patient Instructions (Signed)
We will perform a chest x-ray today to compare with your prior imaging. Please follow Dr. Delton Coombes for any changes in your breathing, worsening functional capacity, new respiratory symptoms.

## 2022-06-06 ENCOUNTER — Other Ambulatory Visit: Payer: Self-pay | Admitting: Internal Medicine

## 2022-06-06 NOTE — Telephone Encounter (Signed)
Good Morning,   Could you please schedule a 12 month follow up visit for this patient? The patient was last seen by Dr. Saunders Revel 06/2021. Thank you so much.

## 2022-06-30 ENCOUNTER — Encounter: Payer: Self-pay | Admitting: Cardiology

## 2022-06-30 ENCOUNTER — Ambulatory Visit: Payer: BC Managed Care – PPO | Attending: Cardiology | Admitting: Cardiology

## 2022-06-30 VITALS — BP 120/80 | HR 96 | Ht 73.0 in | Wt 273.4 lb

## 2022-06-30 DIAGNOSIS — R0609 Other forms of dyspnea: Secondary | ICD-10-CM

## 2022-06-30 DIAGNOSIS — R000 Tachycardia, unspecified: Secondary | ICD-10-CM

## 2022-06-30 DIAGNOSIS — J849 Interstitial pulmonary disease, unspecified: Secondary | ICD-10-CM | POA: Diagnosis not present

## 2022-06-30 DIAGNOSIS — R079 Chest pain, unspecified: Secondary | ICD-10-CM

## 2022-06-30 NOTE — Patient Instructions (Signed)
Medication Instructions:   Your physician recommends that you continue on your current medications as directed. Please refer to the Current Medication list given to you today.  *If you need a refill on your cardiac medications before your next appointment, please call your pharmacy*   Lab Work:  None Ordered  If you have labs (blood work) drawn today and your tests are completely normal, you will receive your results only by: [place] (if you have MyChart) OR A paper copy in the mail If you have any lab test that is abnormal or we need to change your treatment, we will call you to review the results.   Testing/Procedures:  Echocardiogram  Your physician has requested that you have an echocardiogram. Echocardiography is a painless test that uses sound waves to create images of your heart. It provides your doctor with information about the size and shape of your heart and how well your heart's chambers and valves are working. This procedure takes approximately one hour. There are no restrictions for this procedure. Please note; depending on visual quality an IV may need to be placed.     Follow-Up: At [facility], you and your health needs are our priority.  As part of our continuing mission to provide you with exceptional heart care, we have created designated Provider Care Teams.  These Care Teams include your primary Cardiologist (physician) and Advanced Practice Providers (APPs -  Physician Assistants and Nurse Practitioners) who all work together to provide you with the care you need, when you need it.  We recommend signing up for the patient portal called "MyChart".  Sign up information is provided on this After Visit Summary.  MyChart is used to connect with patients for Virtual Visits (Telemedicine).  Patients are able to view lab/test results, encounter notes, upcoming appointments, etc.  Non-urgent messages can be sent to your provider as well.   To learn more  about what you can do with MyChart, go to [email or web address].    Your next appointment:   12 month(s)  The format for your next appointment:   In Person  Provider:   You may see Nelva Bush, MD or one of the following Advanced Practice Providers on your designated Care Team:   Murray Hodgkins, NP Christell Faith, PA-C Cadence Kathlen Mody, PA-C Gerrie Nordmann, NP

## 2022-06-30 NOTE — Progress Notes (Signed)
Cardiology Clinic Note   Patient Name: Brad Lewis Date of Encounter: 06/30/2022  Primary Care Provider:  Lorn Junes, FNP Primary Cardiologist:  Yvonne Kendall, MD  Patient Profile    56 year old male with a history of severe COVID-19 infection in 08/2020 requiring prolonged ICU stay at [facility], hypothyroidism, prediabetes, tachycardia, and chronic shortness of breath, who returns today for follow-up.  Past Medical History    Past Medical History:  Diagnosis Date   COVID-19 08/2020   Hypothyroidism    Prediabetes    Past Surgical History:  Procedure Laterality Date   SHOULDER SURGERY Right     Allergies  No Known Allergies  History of Present Illness    Brad Lewis is a 56 year old male with a past medical history of severe COVID-19 infection in 08/2020 requiring prolonged ICU stay at [facility], hypothyroidism, diabetes, remote history of tachycardia, and chronic shortness of breath that is followed by pulmonary for his interstitial lung disease.  Echocardiogram completed in 11/21/2020 revealed LVEF of 55-60%, no regional wall motion abnormalities, G1 DD, and no valvular abnormalities.  Underwent coronary CT in 01/24/2021 which revealed normal coronary calcium score of 0.  Normal coronary origin with right dominance, no evidence of CAD, CAD-RADS 0, consider nonatherosclerotic causes of chest discomfort.  He was last seen in clinic by Dr. Okey Dupre on  06/13/2021 where he continued to report a stable exertional dyspnea.  He has been trying to improve with increasing his activity by improving his stamina and respiratory function.  He still noted occasional right-sided chest pain when he was out of breath when exerting himself.  There were no changes to his medication regimen during this visit any further testing ordered he was encouraged to increase his activity and continue with follow-ups with pulmonary.  He returns to clinic today stating that overall he has been doing fairly  well.  He continues to have shortness of breath from a long COVID and lung scarring.  He did follow-up with pulmonary and was noted to still have some abnormalities on chest x-ray.  He denies any chest pain, chest tightness, palpitations.  Denies any hospitalizations or visits to the emergency department.  He has done well on Toprol-XL.   Home Medications    Current Outpatient Medications  Medication Sig Dispense Refill   levothyroxine (SYNTHROID) 100 MCG tablet Take 100 mcg by mouth daily.     metoprolol succinate (TOPROL-XL) 25 MG 24 hr tablet Take 1 tablet (25 mg total) by mouth daily. 90 tablet 0   Multiple Vitamin (ONE-A-DAY MENS PO) Take by mouth daily.     No current facility-administered medications for this visit.     Family History    Family History  Problem Relation Age of Onset   Dementia Mother    Suicidality Father    Multiple sclerosis Sister    Heart disease Neg Hx    He indicated that his mother is alive. He indicated that his father is deceased. He indicated that only one of his two sisters is alive. He indicated that his brother is alive. He indicated that the status of his neg hx is unknown.  Social History    Social History   Socioeconomic History   Marital status: Single    Spouse name: Not on file   Number of children: Not on file   Years of education: Not on file   Highest education level: Not on file  Occupational History   Not on file  Tobacco  Use   Smoking status: Former    Packs/day: 0.50    Years: 30.00    Total pack years: 15.00    Types: Cigarettes   Smokeless tobacco: Never   Tobacco comments:    Quit 10-15 years ago per patient 02/20/21 [facility].  Vaping Use   Vaping Use: Never used  Substance and Sexual Activity   Alcohol use: Never   Drug use: Never   Sexual activity: Not on file  Other Topics Concern   Not on file  Social History Narrative   Not on file   Social Determinants of Health   Financial Resource Strain: Not on file   Food Insecurity: Not on file  Transportation Needs: Not on file  Physical Activity: Not on file  Stress: Not on file  Social Connections: Not on file  Intimate Partner Violence: Not on file     Review of Systems    General:  No chills, fever, night sweats or weight changes.  Cardiovascular:  No chest pain, endorses chronic dyspnea on exertion, edema, orthopnea, palpitations, paroxysmal nocturnal dyspnea. Dermatological: No rash, lesions/masses Respiratory: No cough, versus chronic dyspnea Urologic: No hematuria, dysuria Abdominal:   No nausea, vomiting, diarrhea, bright red blood per rectum, melena, or hematemesis Neurologic:  No visual changes, wkns, changes in mental status. All other systems reviewed and are otherwise negative except as noted above.     Physical Exam    VS:  BP 120/80 (BP Location: Left Arm, Patient Position: Sitting, Cuff Size: Normal)   Pulse 96   Ht 6\' 1"  (1.854 m)   Wt 273 lb 6.4 oz (124 kg)   SpO2 96%   BMI 36.07 kg/m  , BMI Body mass index is 36.07 kg/m.     GEN: Well nourished, well developed, in no acute distress. HEENT: normal. Neck: Supple, no JVD, carotid bruits, or masses. Cardiac: RRR, no murmurs, rubs, or gallops. No clubbing, cyanosis, edema.  Radials/DP/PT 2+ and equal bilaterally.  Respiratory:  Respirations regular and unlabored, clear to auscultation bilaterally. GI: Soft, nontender, nondistended, BS + x 4. MS: no deformity or atrophy. Skin: warm and dry, no rash. Neuro:  Strength and sensation are intact. Psych: Normal affect.  Accessory Clinical Findings    ECG personally reviewed by me today-normal sinus rhythm with a rate of 94 with left axis deviation- No acute changes  Lab Results  Component Value Date   WBC 6.9 07/18/2013   HGB 14.6 07/18/2013   HCT 41.9 07/18/2013   MCV 89 07/18/2013   PLT 255 07/18/2013   Lab Results  Component Value Date   CREATININE 1.16 01/23/2021   BUN 18 01/23/2021   NA 138 01/23/2021    K 4.5 01/23/2021   CL 103 01/23/2021   CO2 26 01/23/2021   No results found for: "ALT", "AST", "GGT", "ALKPHOS", "BILITOT" No results found for: "CHOL", "HDL", "LDLCALC", "LDLDIRECT", "TRIG", "CHOLHDL"  No results found for: "HGBA1C"  Assessment & Plan   1.  Chronic dyspnea on exertion and interstitial lung disease.  He had COVID-19 infection in 2021 which is likely the cause of his dyspnea.  He did follow-up with pulmonary and had a recent chest x-ray that continue to show scarring.  He has been trying to increase his activity as tolerated to improve his cardiovascular stamina, but continues to have shortness of breath.  He is euvolemic on exam so it is unlikely that his heart failure.  We will repeat echocardiogram to make sure there is no changes noted.  2.  Sinus tachycardia that is improved since been on Toprol-XL.  EKG today shows heart rate of 94.  He has tolerated the medication well without any unforeseen side effects.  He requires no refills today medications today.  3.  Disposition patient return to clinic to see MD/APP in 1 year or sooner if needed.  He has been advised he will be given the results of his echocardiogram via telephone unless there are any changes that need further discussion.  Requesting his most recent labs from his PCP as they have yet been scanned into the system as he states he just had blood work done at [facility].  Sihaam Chrobak, NP 06/30/2022, 4:12 PM

## 2022-07-02 ENCOUNTER — Telehealth: Payer: Self-pay | Admitting: *Deleted

## 2022-07-02 NOTE — Telephone Encounter (Signed)
-----   Message from Gerrie Nordmann, NP sent at 06/30/2022  4:10 PM EDT ----- Regarding: labs Please request labs from PCP. They are usually scanned in but I was unable to locate them. Thank you.Brad KitchenMarland Lewis

## 2022-07-02 NOTE — Telephone Encounter (Signed)
Called PCP and requested labs. Provided our fax number and she will send those to Attention Olin Hauser.

## 2022-07-17 NOTE — Telephone Encounter (Signed)
Labs were received, scanned into Epic, and reviewed by provider.

## 2022-07-24 ENCOUNTER — Ambulatory Visit: Payer: BC Managed Care – PPO | Attending: Cardiology

## 2022-07-24 DIAGNOSIS — R0609 Other forms of dyspnea: Secondary | ICD-10-CM

## 2022-07-24 LAB — ECHOCARDIOGRAM COMPLETE
AR max vel: 3.44 cm2
AV Area VTI: 3.36 cm2
AV Area mean vel: 3.22 cm2
AV Mean grad: 2 mmHg
AV Peak grad: 4 mmHg
Ao pk vel: 1 m/s
Area-P 1/2: 2.56 cm2
S' Lateral: 2.1 cm

## 2022-09-05 ENCOUNTER — Other Ambulatory Visit: Payer: Self-pay | Admitting: Internal Medicine

## 2023-03-01 ENCOUNTER — Other Ambulatory Visit: Payer: Self-pay | Admitting: Internal Medicine

## 2023-05-31 ENCOUNTER — Other Ambulatory Visit: Payer: Self-pay | Admitting: Internal Medicine

## 2023-06-01 NOTE — Telephone Encounter (Signed)
Please contact pt for future appointment. Pt due for f/u.

## 2023-06-29 ENCOUNTER — Encounter: Payer: Self-pay | Admitting: *Deleted

## 2023-07-02 NOTE — Progress Notes (Signed)
Cardiology Office Note:  .   Date:  07/03/2023  ID:  IRENE WILLEVER, DOB 10-01-1965, MRN [id number] PCP: Lorn Junes, FNP  [facility] Providers Cardiologist:  Yvonne Kendall, MD    History of Present Illness: .   Brad Lewis is a 57 y.o. male with past medical history of severe COVID-19 infection in 08/2020 mg (emergency, hypothyroidism, prediabetes, tachycardia, chronic shortness of breath, returns today for follow-up.  Echocardiogram completed 11/2020 revealed LVEF 55 to 60%, no RWMA, G1 DD, no valvular abnormalities.  Coronary CTA 01/2021 revealed normal coronary calcium score of 0.  He was last seen in clinic 06/30/2022 stating overall he was doing fairly well.  Continues to have some shortness of breath with long COVID and lung scarring.  He also continue to follow with pulmonary at that time.  Repeat echocardiogram was ordered.  Which revealed LVEF 60 to 65%, no RWMA, G1 DD, and no valvular abnormalities.  He returns to clinic today stating that he has been doing well from the cardiac perspective.He continues to have some exertional shortness of breath that has remained stable from prior COVID 19 infection with long COVID and lung scarring. He continues to drive a truck. Has been working on changing his diet and in no longer drinking sodas. He has had a weight loss of 8 pounds.  He was released by pulmonary stating that over years the scarring would improve.  He has been working to increase his activity and make dietary changes.  He has been tolerant of his medications with no adverse effects.  He denies any hospitalizations or visits to the emergency department.  ROS: 10 point review of systems has been reviewed and considered negative except what is been listed in the HPI  Studies Reviewed: Marland Kitchen   EKG Interpretation Date/Time:  Friday July 03 2023 15:15:53 EDT Ventricular Rate:  87 PR Interval:  162 QRS Duration:  84 QT Interval:  354 QTC Calculation: 425 R  Axis:   0  Text Interpretation: Normal sinus rhythm Minimal voltage criteria for LVH, may be normal variant ( R in aVL ) No previous ECGs available Confirmed by Charlsie Quest ([id number]) on 07/03/2023 3:22:41 PM   TTE 07/24/22 1. Left ventricular ejection fraction, by estimation, is 60 to 65%. The  left ventricle has normal function. The left ventricle has no regional  wall motion abnormalities. Left ventricular diastolic parameters are  consistent with Grade I diastolic  dysfunction (impaired relaxation). The average left ventricular global  longitudinal strain is -14.7 %.   2. Right ventricular systolic function is normal. The right ventricular  size is normal. Tricuspid regurgitation signal is inadequate for assessing  PA pressure.   3. The mitral valve is normal in structure. No evidence of mitral valve  regurgitation. No evidence of mitral stenosis.   4. The aortic valve is normal in structure. Aortic valve regurgitation is  not visualized. No aortic stenosis is present.   5. The inferior vena cava is normal in size with greater than 50%  respiratory variability, suggesting right atrial pressure of 3 mmHg.   Coronary CTA 01/24/21 IMPRESSION: 1. Normal coronary calcium score of 0. Patient is low risk for coronary events.   2. Normal coronary origin with right dominance.   3. No evidence of CAD.   4. CAD-RADS 0. Consider non-atherosclerotic causes of chest pain.  TTE 11/21/20  1. Left ventricular ejection fraction, by estimation, is 55 to 60%. The  left ventricle has normal function. The left  ventricle has no regional  wall motion abnormalities. There is mild left ventricular hypertrophy.  Left ventricular diastolic parameters  are consistent with Grade I diastolic dysfunction (impaired relaxation).  The average left ventricular global longitudinal strain is -15.4 %. The  global longitudinal strain is abnormal.   2. Right ventricular systolic function is normal. The right  ventricular  size is mildly enlarged.   3. The mitral valve is normal in structure. No evidence of mitral valve  regurgitation.   4. The aortic valve is tricuspid. Aortic valve regurgitation is not  visualized.   5. The inferior vena cava is normal in size with greater than 50%  respiratory variability, suggesting right atrial pressure of 3 mmHg.  Risk Assessment/Calculations:             Physical Exam:   VS:  BP 122/84 (BP Location: Left Arm, Patient Position: Sitting, Cuff Size: Large)   Pulse 87   Ht 6\' 1"  (1.854 m)   Wt 265 lb 12.8 oz (120.6 kg)   SpO2 96%   BMI 35.07 kg/m    Wt Readings from Last 3 Encounters:  07/03/23 265 lb 12.8 oz (120.6 kg)  06/30/22 273 lb 6.4 oz (124 kg)  04/16/22 271 lb 9.6 oz (123.2 kg)    GEN: Well nourished, well developed in no acute distress NECK: No JVD; No carotid bruits CARDIAC: RRR, no murmurs, rubs, gallops RESPIRATORY:  Clear to auscultation without rales, wheezing or rhonchi  ABDOMEN: Soft, non-tender, non-distended EXTREMITIES:  No edema; No deformity   ASSESSMENT AND PLAN: .   Chronic dyspnea on exertion interstitial lung disease from previous COVID-19 infection in 2021 and long COVID.  Echocardiogram was repeated last year which revealed an LVEF of 60 to 65%, no RWMA, G1 DD, and no valvular abnormalities.  He continues to have shortness of breath that is stable and is unchanged.  Been encouraged to continue to decrease his weight with increasing his activity and continue with his dietary changes.  History of sinus tachycardia with EKG completed today showed normal sinus rhythm with a rate of 87 with LVH.  He is continued on Toprol-XL 25 mg daily.  Hypothyroidism where he is continued on levothyroxine.  This continues to be followed by his PCP.       Dispo: Patient to return to clinic to see MD/APP in 1 year or sooner if needed to reevaluate symptoms  Signed, Dang Mathison, NP

## 2023-07-03 ENCOUNTER — Ambulatory Visit: Payer: BC Managed Care – PPO | Attending: Cardiology | Admitting: Cardiology

## 2023-07-03 ENCOUNTER — Encounter: Payer: Self-pay | Admitting: Cardiology

## 2023-07-03 VITALS — BP 122/84 | HR 87 | Ht 73.0 in | Wt 265.8 lb

## 2023-07-03 DIAGNOSIS — R0609 Other forms of dyspnea: Secondary | ICD-10-CM | POA: Diagnosis not present

## 2023-07-03 DIAGNOSIS — Z8679 Personal history of other diseases of the circulatory system: Secondary | ICD-10-CM

## 2023-07-03 DIAGNOSIS — J849 Interstitial pulmonary disease, unspecified: Secondary | ICD-10-CM

## 2023-07-03 DIAGNOSIS — E039 Hypothyroidism, unspecified: Secondary | ICD-10-CM | POA: Diagnosis not present

## 2023-07-03 NOTE — Patient Instructions (Signed)
Medication Instructions:  Your physician recommends that you continue on your current medications as directed. Please refer to the Current Medication list given to you today.  *If you need a refill on your cardiac medications before your next appointment, please call your pharmacy*  Lab Work: -None ordered  Testing/Procedures: -None ordered  Follow-Up: At [facility], you and your health needs are our priority.  As part of our continuing mission to provide you with exceptional heart care, we have created designated Provider Care Teams.  These Care Teams include your primary Cardiologist (physician) and Advanced Practice Providers (APPs -  Physician Assistants and Nurse Practitioners) who all work together to provide you with the care you need, when you need it.  Your next appointment:   1 year(s)  Provider:   You may see Yvonne Kendall, MD or one of the following Advanced Practice Providers on your designated Care Team:   Nicolasa Ducking, NP Eula Listen, PA-C Cadence Fransico Michael, PA-C Charlsie Quest, NP    Other Instructions -None

## 2023-08-28 ENCOUNTER — Other Ambulatory Visit: Payer: Self-pay | Admitting: Internal Medicine

## 2023-11-01 ENCOUNTER — Encounter: Payer: Self-pay | Admitting: Pulmonary Disease

## 2024-04-18 DIAGNOSIS — Z0189 Encounter for other specified special examinations: Secondary | ICD-10-CM | POA: Diagnosis not present

## 2024-04-20 DIAGNOSIS — E785 Hyperlipidemia, unspecified: Secondary | ICD-10-CM | POA: Diagnosis not present

## 2024-04-20 DIAGNOSIS — E039 Hypothyroidism, unspecified: Secondary | ICD-10-CM | POA: Diagnosis not present

## 2024-04-25 ENCOUNTER — Encounter: Payer: Self-pay | Admitting: *Deleted

## 2024-04-25 ENCOUNTER — Telehealth: Payer: Self-pay | Admitting: Internal Medicine

## 2024-04-25 NOTE — Telephone Encounter (Signed)
 Patient called in due to elevated BP readings. He reports that on Monday his BP was checked when he had his labs done. At that time it was 187/90 and then he checked again on Friday at pharmacy and it was 178/87. At one point it was normal at 117/87. He does take his metoprolol  daily and these readings were after his medications. He has not been in to see us  in a year so advised we need to get him set up for appointment. He was agreeable so appointment made for tomorrow to see our APP. He was appreciative with no further questions.

## 2024-04-25 NOTE — Progress Notes (Unsigned)
 Cardiology Office Note   Date:  04/26/2024  ID:  Brad Lewis, DOB 1966/02/03, MRN [id number] PCP: Rollene Therisa BRAVO, FNP (Inactive)   [facility] Providers Cardiologist:  Lonni Hanson, MD     History of Present Illness Brad Lewis is a 58 y.o. male with a past medical history of severe COVID-19 infection (08/2020) hypothyroidism, prediabetes, tachycardia, chronic shortness of breath, who is here today for follow-up on concerns of elevated blood pressure.   Prior echocardiogram completed 11/2020 revealed LVEF 55 to 60%, no RWMA, G1 DD, no valvular abnormalities.  Coronary CTA completed in 01/2019 revealed normal calcium score of 0.  He was seen in clinic 06/30/2022 stated overall he was doing fairly well.  Continues to have some shortness of breath with long COVID and lung scarring.  He continues to be followed by pulmonary.  Repeat echocardiogram was ordered.  LVEF 60 to 65%, no RWMA, G1 DD, no valvular abnormalities.   He was last seen in clinic in 07/03/2023 stating that he had been doing fairly well from a cardiac perspective.  Continues to have exertional shortness of breath that remained stable from prior COVID-19 infection.  There were no medication changes that were made and further testing was ordered at that time.  He returns to clinic today with concerns of having elevated blood pressure after recent PCP visit.  He denies his blood pressure been 180/90 and 178/87 when he went to the pharmacy to pick up the prescription.  With occasional lightheadedness dizziness and headache from elevated blood pressure he was urged to make his follow-up appointment.  He returns to clinic today with continuing occasional headache occasional lightheadedness or dizziness.  Denies any chest pain.  Has chronic shortness of breath and dyspnea on exertion.  Denies any peripheral edema.  Denies any hospitalizations or visits to the emergency department.  ROS: 10 point review of systems has been  reviewed and considered negative except ones are listed in the HPI  Studies Reviewed EKG Interpretation Date/Time:  Tuesday April 26 2024 08:58:22 EDT Ventricular Rate:  81 PR Interval:  180 QRS Duration:  88 QT Interval:  360 QTC Calculation: 418 R Axis:   0  Text Interpretation: Normal sinus rhythm Minimal voltage criteria for LVH, may be normal variant ( R in aVL ) When compared with ECG of 03-Jul-2023 15:15, No significant change was found Confirmed by Gerard Frederick ([id number]) on 04/26/2024 8:59:06 AM    TTE 07/24/22 1. Left ventricular ejection fraction, by estimation, is 60 to 65%. The  left ventricle has normal function. The left ventricle has no regional  wall motion abnormalities. Left ventricular diastolic parameters are  consistent with Grade I diastolic  dysfunction (impaired relaxation). The average left ventricular global  longitudinal strain is -14.7 %.   2. Right ventricular systolic function is normal. The right ventricular  size is normal. Tricuspid regurgitation signal is inadequate for assessing  PA pressure.   3. The mitral valve is normal in structure. No evidence of mitral valve  regurgitation. No evidence of mitral stenosis.   4. The aortic valve is normal in structure. Aortic valve regurgitation is  not visualized. No aortic stenosis is present.   5. The inferior vena cava is normal in size with greater than 50%  respiratory variability, suggesting right atrial pressure of 3 mmHg.    Coronary CTA 01/24/21 IMPRESSION: 1. Normal coronary calcium score of 0. Patient is low risk for coronary events.   2. Normal coronary origin with right dominance.  3. No evidence of CAD.   4. CAD-RADS 0. Consider non-atherosclerotic causes of chest pain.   TTE 11/21/20  1. Left ventricular ejection fraction, by estimation, is 55 to 60%. The  left ventricle has normal function. The left ventricle has no regional  wall motion abnormalities. There is mild left ventricular  hypertrophy.  Left ventricular diastolic parameters  are consistent with Grade I diastolic dysfunction (impaired relaxation).  The average left ventricular global longitudinal strain is -15.4 %. The  global longitudinal strain is abnormal.   2. Right ventricular systolic function is normal. The right ventricular  size is mildly enlarged.   3. The mitral valve is normal in structure. No evidence of mitral valve  regurgitation.   4. The aortic valve is tricuspid. Aortic valve regurgitation is not  visualized.   5. The inferior vena cava is normal in size with greater than 50%  respiratory variability, suggesting right atrial pressure of 3 mmHg.  Risk Assessment/Calculations           Physical Exam VS:  BP 134/88   Pulse 81   Ht 6' 1 (1.854 m)   Wt 280 lb 3.2 oz (127.1 kg)   SpO2 98%   BMI 36.97 kg/m        Wt Readings from Last 3 Encounters:  04/26/24 280 lb 3.2 oz (127.1 kg)  07/03/23 265 lb 12.8 oz (120.6 kg)  06/30/22 273 lb 6.4 oz (124 kg)    GEN: Well nourished, well developed in no acute distress NECK: No JVD; No carotid bruits CARDIAC: RRR, no murmurs, rubs, gallops RESPIRATORY:  Clear to auscultation without rales, wheezing or rhonchi  ABDOMEN: Soft, non-tender, non-distended EXTREMITIES:  No edema; No deformity   ASSESSMENT AND PLAN Chronic dyspnea on exertion/interstitial lung disease from prior COVID-19 infection in 2021 and long COVID.  Echocardiogram revealed LVEF 60 to 65%, no RWMA, G1 DD, no valvular abnormalities.  He continues to have dyspnea and shortness of breath.  With his last any being completed in 2023 he has been scheduled for an updated echocardiogram to ensure no functional or structural changes since changes in blood pressure.  Elevated blood pressure without the diagnosis of hypertension with blood pressure 130/88 today.  He has had an increase of blood pressure to 180/90-1 70/87.  He is currently on Toprol -XL 25 mg daily will double it to 50 mg  daily for better blood pressure control.  He has been encouraged to continue to monitor his pressure 1 to 2 hours postmedication administration at home.  He has also been advised to bring his monitor with him to his next appointment to correlate with manual pressures in the office.  Hypothyroidism with his last TSH is 7.18.  His levothyroxine has been increased to 225 mcg daily by his primary care provider.  History of sinus tachycardia with heart rate today on EKG sinus rhythm with 81 bpm.  Rates have improved with low-dose metoprolol .  Will likely continue metoprolol  indefinitely.  Obesity with a BMI of 36.97.  It would be beneficial for weight loss to assist with blood pressure management and ongoing DOE.        Dispo: Patient to return to clinic with MD/APP in 3 months or sooner if needed for further evaluation  Signed, Valentin Benney, NP

## 2024-04-25 NOTE — Telephone Encounter (Signed)
 Pt c/o BP issue: STAT if pt c/o blurred vision, one-sided weakness or slurred speech.  STAT if BP is GREATER than 180/120 TODAY.  STAT if BP is LESS than 90/60 and SYMPTOMATIC TODAY  1. What is your BP concern? Elevated BP  2. Have you taken any BP medication today?yes  3. What are your last 5 BP readings?187/90, 178/87  4. Are you having any other symptoms (ex. Dizziness, headache, blurred vision, passed out)? dizziness

## 2024-04-26 ENCOUNTER — Encounter: Payer: Self-pay | Admitting: Cardiology

## 2024-04-26 ENCOUNTER — Ambulatory Visit: Attending: Cardiology | Admitting: Cardiology

## 2024-04-26 VITALS — BP 134/88 | HR 81 | Ht 73.0 in | Wt 280.2 lb

## 2024-04-26 DIAGNOSIS — U099 Post covid-19 condition, unspecified: Secondary | ICD-10-CM

## 2024-04-26 DIAGNOSIS — E669 Obesity, unspecified: Secondary | ICD-10-CM

## 2024-04-26 DIAGNOSIS — R030 Elevated blood-pressure reading, without diagnosis of hypertension: Secondary | ICD-10-CM

## 2024-04-26 DIAGNOSIS — E039 Hypothyroidism, unspecified: Secondary | ICD-10-CM

## 2024-04-26 DIAGNOSIS — R0602 Shortness of breath: Secondary | ICD-10-CM

## 2024-04-26 DIAGNOSIS — R0609 Other forms of dyspnea: Secondary | ICD-10-CM

## 2024-04-26 DIAGNOSIS — Z8679 Personal history of other diseases of the circulatory system: Secondary | ICD-10-CM | POA: Diagnosis not present

## 2024-04-26 DIAGNOSIS — J849 Interstitial pulmonary disease, unspecified: Secondary | ICD-10-CM

## 2024-04-26 MED ORDER — METOPROLOL SUCCINATE ER 50 MG PO TB24: 50.0000 mg | ORAL_TABLET | Freq: Every day | ORAL | 3 refills | Status: AC

## 2024-04-26 NOTE — Patient Instructions (Signed)
 Medication Instructions:  Your physician recommends the following medication changes.  INCREASE: Toprol  XL to 50 mg daily   *If you need a refill on your cardiac medications before your next appointment, please call your pharmacy*  Lab Work: No labs ordered today  If you have labs (blood work) drawn today and your tests are completely normal, you will receive your results only by: MyChart Message (if you have MyChart) OR A paper copy in the mail If you have any lab test that is abnormal or we need to change your treatment, we will call you to review the results.  Testing/Procedures: Your physician has requested that you have an echocardiogram. Echocardiography is a painless test that uses sound waves to create images of your heart. It provides your doctor with information about the size and shape of your heart and how well your heart's chambers and valves are working.   You may receive an ultrasound enhancing agent through an IV if needed to better visualize your heart during the echo. This procedure takes approximately one hour.  There are no restrictions for this procedure.  This will take place at [address] [facility]) [address], [place] [address]  Please note: We ask at that you not bring children with you during ultrasound (echo/ vascular) testing. Due to room size and safety concerns, children are not allowed in the ultrasound rooms during exams. Our front office staff cannot provide observation of children in our lobby area while testing is being conducted. An adult accompanying a patient to their appointment will only be allowed in the ultrasound room at the discretion of the ultrasound technician under special circumstances. We apologize for any inconvenience.   Follow-Up: At [facility], you and your health needs are our priority.  As part of our continuing mission to provide you with exceptional heart care, our providers are all part of one team.  This  team includes your primary Cardiologist (physician) and Advanced Practice Providers or APPs (Physician Assistants and Nurse Practitioners) who all work together to provide you with the care you need, when you need it.  Your next appointment:   3 month(s)  Provider:   Lonni Hanson, MD or Tylene Lunch, NP    We recommend signing up for the patient portal called MyChart.  Sign up information is provided on this After Visit Summary.  MyChart is used to connect with patients for Virtual Visits (Telemedicine).  Patients are able to view lab/test results, encounter notes, upcoming appointments, etc.  Non-urgent messages can be sent to your provider as well.   To learn more about what you can do with MyChart, go to [email or web address].

## 2024-06-02 ENCOUNTER — Ambulatory Visit: Attending: Cardiology

## 2024-06-02 DIAGNOSIS — R0602 Shortness of breath: Secondary | ICD-10-CM | POA: Diagnosis not present

## 2024-06-02 LAB — ECHOCARDIOGRAM COMPLETE
AR max vel: 3.08 cm2
AV Area VTI: 3.11 cm2
AV Area mean vel: 2.92 cm2
AV Mean grad: 4 mmHg
AV Peak grad: 7.3 mmHg
Ao pk vel: 1.35 m/s
Area-P 1/2: 3.65 cm2
S' Lateral: 3.23 cm

## 2024-06-03 ENCOUNTER — Ambulatory Visit: Payer: Self-pay | Admitting: Cardiology

## 2024-07-27 ENCOUNTER — Ambulatory Visit: Attending: Internal Medicine | Admitting: Internal Medicine

## 2024-07-27 ENCOUNTER — Encounter: Payer: Self-pay | Admitting: Internal Medicine

## 2024-07-27 VITALS — BP 136/84 | HR 82 | Ht 73.0 in | Wt 286.4 lb

## 2024-07-27 DIAGNOSIS — I358 Other nonrheumatic aortic valve disorders: Secondary | ICD-10-CM | POA: Diagnosis not present

## 2024-07-27 DIAGNOSIS — R0609 Other forms of dyspnea: Secondary | ICD-10-CM | POA: Diagnosis not present

## 2024-07-27 DIAGNOSIS — I10 Essential (primary) hypertension: Secondary | ICD-10-CM | POA: Insufficient documentation

## 2024-07-27 MED ORDER — LOSARTAN POTASSIUM 25 MG PO TABS: 25.0000 mg | ORAL_TABLET | Freq: Every day | ORAL | 3 refills | Status: AC

## 2024-07-27 NOTE — Progress Notes (Signed)
 Cardiology Office Note:  .   Date:  07/27/2024  ID:  Dale GORMAN Needle, DOB 10/08/1965, MRN [id number] PCP: Rollene Therisa BRAVO, FNP (Inactive)  [facility] Providers Cardiologist:  Lonni Hanson, MD Cardiology APP:  Gerard Frederick, NP     History of Present Illness: .   Brad Lewis is a 58 y.o. male history of severe COVID-19 infection (08/2020) complicated by chronic shortness of breath, tachycardia, prediabetes, and hypothyroidism, who presents for follow-up of shortness of breath and hypertension.  He was last seen in August by Frederick Gerard, NP, at which time he was most concerned about elevated blood pressures accompanied by lightheadedness and headaches.  Blood pressure was 134/88 at that visit.  His metoprolol  succinate was increased to 50 mg daily.  TTE was repeated, showing normal LVEF with grade 1 diastolic dysfunction and no significant valvular abnormalities.  Today, Mr. Ribaudo reports that he feels fairly well.  He still has exertional dyspnea though this has gradually improved over the last couple of years.  He also has sporadic palpitations when he has stressed without associated symptoms.  He notes some mild dizziness at time to time that is unrelated to his palpitations.  He has not passed out or fallen.  He denies chest pain and edema.  He checks his blood pressure some at home and notes that it is usually similar to today's initial reading in the office  ROS: See HPI  Studies Reviewed: SABRA   EKG Interpretation Date/Time:  Wednesday July 27 2024 08:54:51 EST Ventricular Rate:  82 PR Interval:  168 QRS Duration:  86 QT Interval:  350 QTC Calculation: 408 R Axis:   3  Text Interpretation: Normal sinus rhythm Normal ECG When compared with ECG of 26-Apr-2024 08:58, No significant change was found Confirmed by Jim Philemon ([id number]) on 07/27/2024 9:01:23 AM    TTE (06/02/2024): Normal LV size and wall thickness.  LVEF 55-60% with normal wall motion and grade 1  diastolic dysfunction.  GLS -15.6%.  Normal RV size and function.  Mild left atrial enlargement.  No pericardial effusion.  Calcified mitral valve without regurgitation or stenosis.  Aortic sclerosis without stenosis.  Normal CVP.  Risk Assessment/Calculations:            Physical Exam:   VS:  BP 136/84 (BP Location: Left Arm, Patient Position: Sitting, Cuff Size: Large)   Pulse 82 Comment: 85 oximeter  Ht 6' 1 (1.854 m)   Wt 286 lb 6.4 oz (129.9 kg)   SpO2 97%   BMI 37.79 kg/m    Wt Readings from Last 3 Encounters:  07/27/24 286 lb 6.4 oz (129.9 kg)  04/26/24 280 lb 3.2 oz (127.1 kg)  07/03/23 265 lb 12.8 oz (120.6 kg)    General:  NAD. Neck: No JVD or HJR. Lungs: Clear to auscultation bilaterally without wheezes or crackles. Heart: Regular rate and rhythm with 2/6 systolic murmur. Abdomen: Soft, nontender, nondistended. Extremities: No lower extremity edema.  ASSESSMENT AND PLAN: .    Dyspnea on exertion: This has been a chronic issue following severe COVID-19 infection in 2021.  Workup thus far has not shown any cardiac etiology for his dyspnea.  Symptoms are gradually improving.  I encouraged Mr. Davidow to try to exercise a little bit more to help improve his stamina and to help reduce his weight, which will likely improve his dyspnea further.  Aortic sclerosis: Aortic sclerosis and mild mitral calcification without evidence of any significant stenosis or regurgitation observed on echocardiogram  earlier this year.  This likely explains his soft systolic murmur appreciated on exam today.  No further workup/intervention recommended at this time.  Repeat echo could be considered if symptoms worsen or in 5-10 years for surveillance.  Hypertension: Initial blood pressure today mildly elevated, slightly better but still above target on recheck.  We will plan to continue metoprolol  succinate 50 mg daily and initiate losartan 25 mg daily.  I will have Mr. Umbach get a BMP drawn  through [facility] in about 2 weeks.  Importance of sodium restriction was reinforced.    Dispo: Labs in 2 weeks; return to see me or APP in 3 months.  Signed, Lonni Hanson, MD

## 2024-07-27 NOTE — Patient Instructions (Addendum)
 Medication Instructions:  Your physician recommends the following medication changes.   START TAKING: Losartan  25 mg daily   *If you need a refill on your cardiac medications before your next appointment, please call your pharmacy*  Lab Work: Your provider would like for you to return in 2 weeks  to have the following labs drawn: BMP .   Please go to [facility] [address] [address]) [address], [place] [address] You do not need an appointment.  They are open from 8 am- 4:30 pm.  Lunch from 1:00 pm- 2:00 pm You do not need to be fasting.   Testing/Procedures: No labs ordered today   Follow-Up: At [facility], you and your health needs are our priority.  As part of our continuing mission to provide you with exceptional heart care, our providers are all part of one team.  This team includes your primary Cardiologist (physician) and Advanced Practice Providers or APPs (Physician Assistants and Nurse Practitioners) who all work together to provide you with the care you need, when you need it.  Your next appointment:   3-4  month(s)  Provider:   You may see Lonni Hanson, MD or one of the following Advanced Practice Providers on your designated Care Team:   Lonni Meager, NP Lesley Maffucci, PA-C Bernardino Bring, PA-C Cadence Clark, PA-C Tylene Lunch, NP Barnie Hila, NP    We recommend signing up for the patient portal called MyChart.  Sign up information is provided on this After Visit Summary.  MyChart is used to connect with patients for Virtual Visits (Telemedicine).  Patients are able to view lab/test results, encounter notes, upcoming appointments, etc.  Non-urgent messages can be sent to your provider as well.   To learn more about what you can do with MyChart, go to [email or web address].

## 2024-07-28 DIAGNOSIS — B078 Other viral warts: Secondary | ICD-10-CM | POA: Diagnosis not present

## 2024-07-28 DIAGNOSIS — L821 Other seborrheic keratosis: Secondary | ICD-10-CM | POA: Diagnosis not present

## 2024-07-28 DIAGNOSIS — D485 Neoplasm of uncertain behavior of skin: Secondary | ICD-10-CM | POA: Diagnosis not present

## 2024-10-27 ENCOUNTER — Ambulatory Visit: Admitting: Internal Medicine
